# Patient Record
Sex: Male | Born: 1980 | Race: Black or African American | Hispanic: No | Marital: Single | Smoking: Never smoker
Health system: Southern US, Community
[De-identification: ages and names within clinical notes are randomized; demographics above are authoritative.]

## PROBLEM LIST (undated history)

## (undated) DIAGNOSIS — J45909 Unspecified asthma, uncomplicated: Secondary | ICD-10-CM

## (undated) HISTORY — PX: FOOT SURGERY: SHX648

---

## 2008-07-06 ENCOUNTER — Emergency Department (HOSPITAL_COMMUNITY): Admission: EM | Admit: 2008-07-06 | Discharge: 2008-07-06 | Payer: Self-pay | Admitting: Emergency Medicine

## 2009-01-24 ENCOUNTER — Emergency Department (HOSPITAL_COMMUNITY): Admission: EM | Admit: 2009-01-24 | Discharge: 2009-01-24 | Payer: Self-pay | Admitting: Emergency Medicine

## 2011-05-30 ENCOUNTER — Emergency Department (HOSPITAL_COMMUNITY)
Admission: EM | Admit: 2011-05-30 | Discharge: 2011-05-30 | Disposition: A | Discharge status: Home / Self Care | Payer: Self-pay | Attending: Emergency Medicine | Admitting: Emergency Medicine

## 2011-05-30 DIAGNOSIS — J40 Bronchitis, not specified as acute or chronic: Secondary | ICD-10-CM | POA: Insufficient documentation

## 2011-05-30 DIAGNOSIS — R059 Cough, unspecified: Secondary | ICD-10-CM | POA: Insufficient documentation

## 2011-05-30 DIAGNOSIS — R05 Cough: Secondary | ICD-10-CM | POA: Insufficient documentation

## 2011-05-30 DIAGNOSIS — J45909 Unspecified asthma, uncomplicated: Secondary | ICD-10-CM | POA: Insufficient documentation

## 2011-06-19 ENCOUNTER — Emergency Department (HOSPITAL_COMMUNITY)
Admission: EM | Admit: 2011-06-19 | Discharge: 2011-06-19 | Disposition: A | Discharge status: Home / Self Care | Payer: No Typology Code available for payment source | Attending: Emergency Medicine | Admitting: Emergency Medicine

## 2011-06-19 DIAGNOSIS — M25519 Pain in unspecified shoulder: Secondary | ICD-10-CM | POA: Insufficient documentation

## 2011-06-19 DIAGNOSIS — Y92410 Unspecified street and highway as the place of occurrence of the external cause: Secondary | ICD-10-CM | POA: Insufficient documentation

## 2011-06-19 DIAGNOSIS — S0990XA Unspecified injury of head, initial encounter: Secondary | ICD-10-CM | POA: Insufficient documentation

## 2011-06-23 ENCOUNTER — Emergency Department (HOSPITAL_COMMUNITY)
Admission: EM | Admit: 2011-06-23 | Discharge: 2011-06-23 | Disposition: A | Discharge status: Home / Self Care | Payer: No Typology Code available for payment source | Attending: Emergency Medicine | Admitting: Emergency Medicine

## 2011-06-23 DIAGNOSIS — M542 Cervicalgia: Secondary | ICD-10-CM | POA: Insufficient documentation

## 2011-06-25 ENCOUNTER — Inpatient Hospital Stay (INDEPENDENT_AMBULATORY_CARE_PROVIDER_SITE_OTHER)
Admission: RE | Admit: 2011-06-25 | Discharge: 2011-06-25 | Disposition: A | Discharge status: Home / Self Care | Payer: No Typology Code available for payment source | Source: Ambulatory Visit | Attending: Family Medicine | Admitting: Family Medicine

## 2011-06-25 ENCOUNTER — Ambulatory Visit (INDEPENDENT_AMBULATORY_CARE_PROVIDER_SITE_OTHER): Payer: No Typology Code available for payment source

## 2011-06-25 DIAGNOSIS — S139XXA Sprain of joints and ligaments of unspecified parts of neck, initial encounter: Secondary | ICD-10-CM

## 2011-06-25 DIAGNOSIS — IMO0002 Reserved for concepts with insufficient information to code with codable children: Secondary | ICD-10-CM

## 2011-06-25 DIAGNOSIS — R51 Headache: Secondary | ICD-10-CM

## 2018-02-12 ENCOUNTER — Ambulatory Visit: Payer: Self-pay | Admitting: Nurse Practitioner

## 2018-06-11 ENCOUNTER — Emergency Department (HOSPITAL_COMMUNITY)
Admission: EM | Admit: 2018-06-11 | Discharge: 2018-06-11 | Disposition: A | Discharge status: Home / Self Care | Payer: Self-pay | Attending: Emergency Medicine | Admitting: Emergency Medicine

## 2018-06-11 ENCOUNTER — Encounter (HOSPITAL_COMMUNITY): Payer: Self-pay

## 2018-06-11 ENCOUNTER — Ambulatory Visit (HOSPITAL_COMMUNITY)
Admission: EM | Admit: 2018-06-11 | Discharge: 2018-06-11 | Disposition: A | Discharge status: Home / Self Care | Payer: Self-pay | Attending: Family Medicine | Admitting: Family Medicine

## 2018-06-11 ENCOUNTER — Ambulatory Visit (HOSPITAL_COMMUNITY): Payer: Self-pay

## 2018-06-11 ENCOUNTER — Encounter (HOSPITAL_COMMUNITY): Payer: Self-pay | Admitting: Emergency Medicine

## 2018-06-11 ENCOUNTER — Other Ambulatory Visit: Payer: Self-pay

## 2018-06-11 DIAGNOSIS — M545 Low back pain, unspecified: Secondary | ICD-10-CM

## 2018-06-11 DIAGNOSIS — R9431 Abnormal electrocardiogram [ECG] [EKG]: Secondary | ICD-10-CM

## 2018-06-11 DIAGNOSIS — R1013 Epigastric pain: Secondary | ICD-10-CM | POA: Insufficient documentation

## 2018-06-11 DIAGNOSIS — M67432 Ganglion, left wrist: Secondary | ICD-10-CM

## 2018-06-11 DIAGNOSIS — K5900 Constipation, unspecified: Secondary | ICD-10-CM

## 2018-06-11 LAB — COMPREHENSIVE METABOLIC PANEL
ALK PHOS: 60 U/L (ref 38–126)
ALT: 28 U/L (ref 0–44)
AST: 28 U/L (ref 15–41)
Albumin: 3.7 g/dL (ref 3.5–5.0)
Anion gap: 8 (ref 5–15)
BILIRUBIN TOTAL: 0.6 mg/dL (ref 0.3–1.2)
BUN: 11 mg/dL (ref 6–20)
CALCIUM: 9.2 mg/dL (ref 8.9–10.3)
CO2: 25 mmol/L (ref 22–32)
CREATININE: 1.05 mg/dL (ref 0.61–1.24)
Chloride: 103 mmol/L (ref 98–111)
Glucose, Bld: 91 mg/dL (ref 70–99)
Potassium: 4 mmol/L (ref 3.5–5.1)
Sodium: 136 mmol/L (ref 135–145)
Total Protein: 6.8 g/dL (ref 6.5–8.1)

## 2018-06-11 LAB — CBC
HCT: 43.4 % (ref 39.0–52.0)
Hemoglobin: 14.6 g/dL (ref 13.0–17.0)
MCH: 25.7 pg — ABNORMAL LOW (ref 26.0–34.0)
MCHC: 33.6 g/dL (ref 30.0–36.0)
MCV: 76.5 fL — ABNORMAL LOW (ref 78.0–100.0)
PLATELETS: 264 10*3/uL (ref 150–400)
RBC: 5.67 MIL/uL (ref 4.22–5.81)
RDW: 14 % (ref 11.5–15.5)
WBC: 6.3 10*3/uL (ref 4.0–10.5)

## 2018-06-11 LAB — TROPONIN I

## 2018-06-11 LAB — LIPASE, BLOOD: Lipase: 28 U/L (ref 11–51)

## 2018-06-11 MED ORDER — FAMOTIDINE 20 MG PO TABS
20.0000 mg | ORAL_TABLET | Freq: Once | ORAL | Status: AC
  Administered 2018-06-11: 20 mg via ORAL
  Filled 2018-06-11: qty 1

## 2018-06-11 MED ORDER — GI COCKTAIL ~~LOC~~
30.0000 mL | Freq: Once | ORAL | Status: AC
  Administered 2018-06-11: 30 mL via ORAL

## 2018-06-11 MED ORDER — PANTOPRAZOLE SODIUM 40 MG PO TBEC: 40.0000 mg | DELAYED_RELEASE_TABLET | Freq: Every day | ORAL | 0 refills | Status: DC

## 2018-06-11 MED ORDER — GI COCKTAIL ~~LOC~~
30.0000 mL | Freq: Once | ORAL | Status: AC
  Administered 2018-06-11: 30 mL via ORAL
  Filled 2018-06-11: qty 30

## 2018-06-11 MED ORDER — GI COCKTAIL ~~LOC~~
ORAL | Status: AC
  Filled 2018-06-11: qty 30

## 2018-06-11 NOTE — ED Triage Notes (Signed)
Pt endorses epigastric pain x 3 days with dizziness. Pt went to [facility] and sent here for concerning EKG. VSS. Axox4. Denies CP or shob.

## 2018-06-11 NOTE — ED Provider Notes (Addendum)
[facility]    CSN: [id number] Arrival date & time: 06/11/18  1220     History   Chief Complaint Chief Complaint  Patient presents with  . Abdominal Pain    HPI Colonel Krauser is a 37 y.o. male.   Ed presents with complaints of three days of epigastric pain. Worse at night. No nausea or vomiting. Feels like better if he doesn't eat but unable to confirm if worsens with eating. Has occasional heartburn. Denies any current chest pain. No shortness of breath, cough, dizziness, arm or jaw pain. Low back worse with activity. Took advil two days ago which didn't seem to help with his back pain. Has been constipated. Took a laxative and this helped some but still having to strain to pass stool. Noted some bright red blood after this. No black in stool. Has not taken any other medications for symptoms. Pain 6/10. Also complaints of "bump" to left wrist. Not painful. Without contributing medical history. Denies hx of CAD, htn, or DM. No known cardiac history personally and denies family cardiac history. Doesn't smoke.     ROS per HPI.      History reviewed. No pertinent past medical history.  There are no active problems to display for this patient.   History reviewed. No pertinent surgical history.     Home Medications    Prior to Admission medications   Not on File    Family History No family history on file.  Social History Social History   Tobacco Use  . Smoking status: Not on file  Substance Use Topics  . Alcohol use: Not on file  . Drug use: Not on file     Allergies   Ibuprofen   Review of Systems Review of Systems   Physical Exam Triage Vital Signs ED Triage Vitals  Enc Vitals Group     BP 06/11/18 1335 137/65     Pulse Rate 06/11/18 1335 70     Resp 06/11/18 1335 18     Temp 06/11/18 1335 97.8 F (36.6 C)     Temp src --      SpO2 06/11/18 1335 100 %     Weight 06/11/18 1332 284 lb (128.8 kg)     Height --      Head  Circumference --      Peak Flow --      Pain Score --      Pain Loc --      Pain Edu? --      Excl. in GC? --    No data found.  Updated Vital Signs BP 137/65 (BP Location: Right Arm)   Pulse 70   Temp 97.8 F (36.6 C)   Resp 18   Wt 284 lb (128.8 kg)   SpO2 100%   Physical Exam  Constitutional: He is oriented to person, place, and time. He appears well-developed and well-nourished.  Cardiovascular: Normal rate and regular rhythm.  Pulmonary/Chest: Effort normal and breath sounds normal.  Abdominal: Soft. Bowel sounds are decreased. There is tenderness in the epigastric area. There is no rigidity, no rebound, no guarding, no CVA tenderness, no tenderness at McBurney's point and negative Murphy's sign.  Musculoskeletal:       Lumbar back: He exhibits tenderness and pain. He exhibits normal range of motion, no bony tenderness, no swelling, no edema, no deformity, no laceration, no spasm and normal pulse.  No spinous process tenderness, no step off or deformity; bilateral low back musculature with tenderness  on palpation; strength equal bilaterally; gross sensation intact to lower extremities; ambulatory without difficulty; mobile rubbery lesion to left anterior wrist, no redness or fluctuance, non tender    Neurological: He is alert and oriented to person, place, and time.  Skin: Skin is warm and dry.   Lead III with t wave inversions noted; no previous ekg for compare; NSR rate 66   UC Treatments / Results  Labs (all labs ordered are listed, but only abnormal results are displayed) Labs Reviewed - No data to display  EKG None  Radiology No results found.  Procedures Procedures (including critical care time)  Medications Ordered in UC Medications  gi cocktail (Maalox,Lidocaine,Donnatal) (has no administration in time range)    Initial Impression / Assessment and Plan / UC Course  I have reviewed the triage vital signs and the nursing notes.  Pertinent labs & imaging  results that were available during my care of the patient were reviewed by me and considered in my medical decision making (see chart for details).     Patient with epigastric pain for the past 3 days. Worse at night. Currently pain 4-6/10.  Worse at night. No nausea, vomiting, arm or jaw pain. Low back pain. No known cardiac history. Noted stsegment depression on ekg, no previous for comparison. Recommend complete cardiac eval in ER at this time. Patient provided transport by RN at this time. Patient verbalized understanding and agreeable to plan.    Ekg reviewed with supervising physician Dr. Milus Glazier who recommends ER transport at this time as well.  Final Clinical Impressions(s) / UC Diagnoses   Final diagnoses:  Abdominal pain, epigastric  Gastroesophageal reflux disease, esophagitis presence not specified  Acute bilateral low back pain without sciatica  Constipation, unspecified constipation type  Ganglion cyst of wrist, left   Discharge Instructions   None    ED Prescriptions    None     Controlled Substance Prescriptions [place] Controlled Substance Registry consulted? Not Applicable   Georgetta Haber, NP 06/11/18 1415    Georgetta Haber, NP 06/11/18 1417

## 2018-06-11 NOTE — ED Triage Notes (Signed)
Pt states he has stomach pain x 3 days and back pain.

## 2018-06-11 NOTE — ED Provider Notes (Addendum)
[facility] EMERGENCY DEPARTMENT Provider Note   CSN: [id number] Arrival date & time: 06/11/18  1419     History   Chief Complaint Chief Complaint  Patient presents with  . Abdominal Pain    HPI Jeffery Blackwell is a 37 y.o. male.  Patient c/o epigastric pain for the past 3 days. Symptoms occur at rest. Constant, dull, non radiating, moderate. States if anything is more noticeable/worse when tries to sleep at night. Occasionally worse w eating. No relation to exertion. No associated nv or diaphoresis. No palpitations. No chest pain or discomfort. No sob or unusual doe. No fam hx premature cad. Pt denies hx pud, pancreatitis or gallstones. No fever or chills.   The history is provided by the patient.  Abdominal Pain   Pertinent negatives include fever, vomiting and headaches.    History reviewed. No pertinent past medical history.  There are no active problems to display for this patient.   History reviewed. No pertinent surgical history.      Home Medications    Prior to Admission medications   Not on File    Family History History reviewed. No pertinent family history.  Social History Social History   Tobacco Use  . Smoking status: Never Smoker  Substance Use Topics  . Alcohol use: Never    Frequency: Never  . Drug use: Never     Allergies   Ibuprofen   Review of Systems Review of Systems  Constitutional: Negative for fever.  HENT: Negative for sore throat.   Eyes: Negative for redness.  Respiratory: Negative for shortness of breath.   Cardiovascular: Negative for chest pain and leg swelling.  Gastrointestinal: Positive for abdominal pain. Negative for vomiting.  Genitourinary: Negative for flank pain.  Musculoskeletal: Negative for neck pain.  Skin: Negative for rash.  Neurological: Negative for headaches.  Hematological: Does not bruise/bleed easily.  Psychiatric/Behavioral: Negative for confusion.     Physical  Exam Updated Vital Signs BP 118/85 (BP Location: Right Arm)   Pulse 72   Temp 98 F (36.7 C) (Oral)   Resp 16   Ht 1.829 m (6')   Wt 128.8 kg   SpO2 100%   BMI 38.52 kg/m   Physical Exam  Constitutional: He appears well-developed and well-nourished.  HENT:  Mouth/Throat: Oropharynx is clear and moist.  Eyes: Conjunctivae are normal.  Neck: Neck supple. No tracheal deviation present.  Cardiovascular: Normal rate, regular rhythm, normal heart sounds and intact distal pulses. Exam reveals no gallop and no friction rub.  No murmur heard. Pulmonary/Chest: Effort normal and breath sounds normal. No accessory muscle usage. No respiratory distress.  Abdominal: Soft. Bowel sounds are normal. He exhibits no distension. There is no tenderness.  Musculoskeletal: He exhibits no edema or tenderness.  T/L/S spine non tender, aligned, no step off  Neurological: He is alert.  Skin: Skin is warm and dry.  Psychiatric: He has a normal mood and affect.  Nursing note and vitals reviewed.    ED Treatments / Results  Labs (all labs ordered are listed, but only abnormal results are displayed) Results for orders placed or performed during the hospital encounter of 06/11/18  Lipase, blood  Result Value Ref Range   Lipase 28 11 - 51 U/L  Comprehensive metabolic panel  Result Value Ref Range   Sodium 136 135 - 145 mmol/L   Potassium 4.0 3.5 - 5.1 mmol/L   Chloride 103 98 - 111 mmol/L   CO2 25 22 - 32 mmol/L  Glucose, Bld 91 70 - 99 mg/dL   BUN 11 6 - 20 mg/dL   Creatinine, Ser 2.13 0.61 - 1.24 mg/dL   Calcium 9.2 8.9 - 08.6 mg/dL   Total Protein 6.8 6.5 - 8.1 g/dL   Albumin 3.7 3.5 - 5.0 g/dL   AST 28 15 - 41 U/L   ALT 28 0 - 44 U/L   Alkaline Phosphatase 60 38 - 126 U/L   Total Bilirubin 0.6 0.3 - 1.2 mg/dL   GFR calc non Af Amer >60 >60 mL/min   GFR calc Af Amer >60 >60 mL/min   Anion gap 8 5 - 15  CBC  Result Value Ref Range   WBC 6.3 4.0 - 10.5 K/uL   RBC 5.67 4.22 - 5.81 MIL/uL    Hemoglobin 14.6 13.0 - 17.0 g/dL   HCT 57.8 46.9 - 62.9 %   MCV 76.5 (L) 78.0 - 100.0 fL   MCH 25.7 (L) 26.0 - 34.0 pg   MCHC 33.6 30.0 - 36.0 g/dL   RDW 52.8 41.3 - 24.4 %   Platelets 264 150 - 400 K/uL  Troponin I  Result Value Ref Range   Troponin I <0.03 <0.03 ng/mL   US Abdomen Limited  Result Date: 06/11/2018 CLINICAL DATA:  Upper abdominal pain EXAM: ULTRASOUND ABDOMEN LIMITED RIGHT UPPER QUADRANT COMPARISON:  None. FINDINGS: Gallbladder: No gallstones or wall thickening visualized. There is no pericholecystic fluid. No sonographic Murphy sign noted by sonographer. Common bile duct: Diameter: 4 mm. No intrahepatic or extrahepatic biliary duct dilatation. Liver: No focal lesion identified. Within normal limits in parenchymal echogenicity. Portal vein is patent on color Doppler imaging with normal direction of blood flow towards the liver. IMPRESSION: Study within normal limits. Electronically Signed   By: Bretta Bang III M.D.   On: 06/11/2018 17:13    EKG EKG Interpretation  Date/Time:  Monday June 11 2018 14:22:32 EDT Ventricular Rate:  64 PR Interval:  162 QRS Duration: 84 QT Interval:  380 QTC Calculation: 392 R Axis:   53 Text Interpretation:  Normal sinus rhythm Nonspecific T wave abnormality Abnormal ECG No significant change since last tracing Confirmed by Melene Plan [phone]) on 06/11/2018 2:31:42 PM   Radiology US Abdomen Limited  Result Date: 06/11/2018 CLINICAL DATA:  Upper abdominal pain EXAM: ULTRASOUND ABDOMEN LIMITED RIGHT UPPER QUADRANT COMPARISON:  None. FINDINGS: Gallbladder: No gallstones or wall thickening visualized. There is no pericholecystic fluid. No sonographic Murphy sign noted by sonographer. Common bile duct: Diameter: 4 mm. No intrahepatic or extrahepatic biliary duct dilatation. Liver: No focal lesion identified. Within normal limits in parenchymal echogenicity. Portal vein is patent on color Doppler imaging with normal direction of blood  flow towards the liver. IMPRESSION: Study within normal limits. Electronically Signed   By: Bretta Bang III M.D.   On: 06/11/2018 17:13    Procedures Procedures (including critical care time)  Medications Ordered in ED Medications - No data to display   Initial Impression / Assessment and Plan / ED Course  I have reviewed the triage vital signs and the nursing notes.  Pertinent labs & imaging results that were available during my care of the patient were reviewed by me and considered in my medical decision making (see chart for details).  Labs. Ecg.   Reviewed nursing notes and prior charts for additional history.   For epigastric pain, will try pepcid and gi cocktail for symptom relief - pt with relief of symptoms.   Labs reviewed - after constant/continuous  symptoms for 3 days, trop normal.   U/s reviewed - no gallstones.   Discussed above results w pt.   No current or previous chest discomfort. No sob. abd pain resolved.   ?possible gastritis. rx for home. Close pcp f/u.   Final Clinical Impressions(s) / ED Diagnoses   Final diagnoses:  None    ED Discharge Orders    None         Cathren Laine, MD 06/11/18 [phone]

## 2018-06-11 NOTE — Discharge Instructions (Addendum)
It was our pleasure to provide your ER care today - we hope that you feel better.  Take protonix (acid blocker medication). You may also try pepcid or maalox for symptom relief.   Follow up closely with primary care doctor in the next 1-2 weeks.   Return to ER if worse, new symptoms, fevers, severe abdominal pain, persistent vomiting, chest pain, trouble breathing, other concern.

## 2018-06-11 NOTE — Discharge Instructions (Signed)
Please go to the ER for further evaluation as there are changes on your EKG which are concerning

## 2018-06-11 NOTE — ED Notes (Signed)
Patient verbalizes understanding of discharge instructions. Opportunity for questioning and answers were provided. Armband removed by staff, pt discharged from ED.  

## 2018-06-11 NOTE — ED Notes (Signed)
Patient transported to [facility] 

## 2018-07-06 ENCOUNTER — Encounter (HOSPITAL_BASED_OUTPATIENT_CLINIC_OR_DEPARTMENT_OTHER): Payer: Self-pay

## 2018-07-06 ENCOUNTER — Emergency Department (HOSPITAL_BASED_OUTPATIENT_CLINIC_OR_DEPARTMENT_OTHER)
Admission: EM | Admit: 2018-07-06 | Discharge: 2018-07-06 | Disposition: A | Discharge status: Home / Self Care | Payer: Self-pay | Attending: Emergency Medicine | Admitting: Emergency Medicine

## 2018-07-06 ENCOUNTER — Other Ambulatory Visit: Payer: Self-pay

## 2018-07-06 DIAGNOSIS — R51 Headache: Secondary | ICD-10-CM | POA: Insufficient documentation

## 2018-07-06 DIAGNOSIS — Z79899 Other long term (current) drug therapy: Secondary | ICD-10-CM | POA: Insufficient documentation

## 2018-07-06 DIAGNOSIS — M545 Low back pain, unspecified: Secondary | ICD-10-CM

## 2018-07-06 DIAGNOSIS — M542 Cervicalgia: Secondary | ICD-10-CM | POA: Insufficient documentation

## 2018-07-06 DIAGNOSIS — R519 Headache, unspecified: Secondary | ICD-10-CM

## 2018-07-06 MED ORDER — METHOCARBAMOL 750 MG PO TABS: 750.0000 mg | ORAL_TABLET | Freq: Three times a day (TID) | ORAL | 0 refills | Status: DC | PRN

## 2018-07-06 NOTE — Discharge Instructions (Addendum)
Please take Tylenol (acetaminophen) to relieve your pain.  You may take tylenol, up to 1,000 mg (two extra strength pills) every 6 hours.  Do not take more than 4,000 mg tylenol in a 24 hour period.  Please check all medication labels as many medications such as pain and cold medications may contain tylenol. Please do not drink alcohol while taking this medication.   You are being prescribed a medication which may make you sleepy. For 24 hours after one dose please do not drive, operate heavy machinery, care for a small child with out another adult present, or perform any activities that may cause harm to you or someone else if you were to fall asleep or be impaired.
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
## 2018-07-07 ENCOUNTER — Emergency Department (HOSPITAL_COMMUNITY)
Admission: EM | Admit: 2018-07-07 | Discharge: 2018-07-07 | Disposition: A | Discharge status: Home / Self Care | Payer: Self-pay | Attending: Emergency Medicine | Admitting: Emergency Medicine

## 2018-07-07 ENCOUNTER — Encounter (HOSPITAL_COMMUNITY): Payer: Self-pay | Admitting: Emergency Medicine

## 2018-07-07 ENCOUNTER — Ambulatory Visit (HOSPITAL_COMMUNITY)
Admission: EM | Admit: 2018-07-07 | Discharge: 2018-07-07 | Disposition: A | Discharge status: Home / Self Care | Payer: Self-pay

## 2018-07-07 DIAGNOSIS — Y999 Unspecified external cause status: Secondary | ICD-10-CM | POA: Insufficient documentation

## 2018-07-07 DIAGNOSIS — T148XXA Other injury of unspecified body region, initial encounter: Secondary | ICD-10-CM | POA: Insufficient documentation

## 2018-07-07 DIAGNOSIS — G44309 Post-traumatic headache, unspecified, not intractable: Secondary | ICD-10-CM | POA: Insufficient documentation

## 2018-07-07 DIAGNOSIS — Y9389 Activity, other specified: Secondary | ICD-10-CM | POA: Insufficient documentation

## 2018-07-07 DIAGNOSIS — Y92410 Unspecified street and highway as the place of occurrence of the external cause: Secondary | ICD-10-CM | POA: Insufficient documentation

## 2018-07-07 DIAGNOSIS — J45909 Unspecified asthma, uncomplicated: Secondary | ICD-10-CM | POA: Insufficient documentation

## 2018-07-07 DIAGNOSIS — Z79899 Other long term (current) drug therapy: Secondary | ICD-10-CM | POA: Insufficient documentation

## 2018-07-07 HISTORY — DX: Unspecified asthma, uncomplicated: J45.909
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
## 2018-07-07 NOTE — Discharge Instructions (Addendum)
Continue taking Robaxin 3 times a day. Continue taking Tylenol 3 times a day. Use muscle cream such as salonpas, icy hot, BenGay to help with your pain. Decrease stimulation to the brain.  This means decrease lights, sounds, and screen time. Follow-up with Dr. Katrinka Blazing for further evaluation of your headaches if they persist for more than a week. You likely have continued muscle pain and soreness over the next several days.  This should be improving within a week.  If it is not, follow-up with Dr. Katrinka Blazing. Return to the emergency room if you develop vision loss, vomiting, numbness, difficulty walking, or any new or concerning symptom.
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
## 2018-07-11 ENCOUNTER — Telehealth: Payer: Self-pay
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
## 2018-07-12 ENCOUNTER — Ambulatory Visit (INDEPENDENT_AMBULATORY_CARE_PROVIDER_SITE_OTHER): Payer: Self-pay | Admitting: Family Medicine

## 2018-07-12 DIAGNOSIS — S060X0A Concussion without loss of consciousness, initial encounter: Secondary | ICD-10-CM

## 2018-07-12 DIAGNOSIS — S060XAA Concussion with loss of consciousness status unknown, initial encounter: Secondary | ICD-10-CM | POA: Insufficient documentation

## 2018-07-12 DIAGNOSIS — S060X9A Concussion with loss of consciousness of unspecified duration, initial encounter: Secondary | ICD-10-CM | POA: Insufficient documentation

## 2018-07-12 NOTE — Patient Instructions (Signed)
Good to see you  Mild concussion  Stay hydrated  Fish oil 3 grams dialy for 10 days then 2 grams daily thereafter Vitamin D 2000 IU daily  CoQ10 200mg  daily  See [place] again on Tuesday and hopefully we will get you back to work
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
## 2018-07-17 ENCOUNTER — Ambulatory Visit: Payer: Self-pay | Admitting: Family Medicine

## 2018-07-17 DIAGNOSIS — S060X0D Concussion without loss of consciousness, subsequent encounter: Secondary | ICD-10-CM

## 2018-07-17 MED ORDER — TRAZODONE HCL 50 MG PO TABS: 25.0000 mg | ORAL_TABLET | Freq: Every evening | ORAL | 3 refills | Status: DC | PRN
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
## 2018-07-17 NOTE — Assessment & Plan Note (Signed)
Patient is making improvement.  On physical exam nothing significant.  Patient still seems somewhat tired though.  Patient was to decrease the muscle relaxer and seems to be doing better overall but does have some irritability. Sleep troubles and trazodone.  Patient will come back and see me again in 10 days
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
## 2018-07-30 ENCOUNTER — Encounter: Payer: Self-pay | Admitting: Family Medicine

## 2018-07-30 ENCOUNTER — Ambulatory Visit (INDEPENDENT_AMBULATORY_CARE_PROVIDER_SITE_OTHER): Payer: Self-pay | Admitting: Family Medicine

## 2018-07-30 DIAGNOSIS — S060X0D Concussion without loss of consciousness, subsequent encounter: Secondary | ICD-10-CM

## 2018-07-30 NOTE — Patient Instructions (Signed)
Good to see you  We will get you back on Monday  Stop one vitamin a week until they are all gone.  Get a little more active See me again only if you need me Happy holidays!

## 2018-07-30 NOTE — Assessment & Plan Note (Signed)
Patient symptoms have completely resolved at this time.  Patient will start with full duty on Monday.  Discussed icing regimen and home exercises and the vitamin supplementation and decreasing appropriately.  Follow-up again as needed

## 2018-09-12 ENCOUNTER — Ambulatory Visit (HOSPITAL_COMMUNITY)
Admission: EM | Admit: 2018-09-12 | Discharge: 2018-09-12 | Disposition: A | Discharge status: Home / Self Care | Payer: Self-pay | Attending: Family Medicine | Admitting: Family Medicine

## 2018-09-12 ENCOUNTER — Encounter (HOSPITAL_COMMUNITY): Payer: Self-pay

## 2018-09-12 ENCOUNTER — Other Ambulatory Visit: Payer: Self-pay

## 2018-09-12 DIAGNOSIS — M5442 Lumbago with sciatica, left side: Secondary | ICD-10-CM

## 2018-09-12 DIAGNOSIS — M5441 Lumbago with sciatica, right side: Secondary | ICD-10-CM

## 2018-09-12 DIAGNOSIS — G8929 Other chronic pain: Secondary | ICD-10-CM

## 2018-09-12 MED ORDER — KETOROLAC TROMETHAMINE 60 MG/2ML IM SOLN
INTRAMUSCULAR | Status: AC
  Filled 2018-09-12: qty 2

## 2018-09-12 MED ORDER — PREDNISONE 10 MG PO TABS: 20.0000 mg | ORAL_TABLET | Freq: Every day | ORAL | 0 refills | Status: DC

## 2018-09-12 MED ORDER — KETOROLAC TROMETHAMINE 60 MG/2ML IM SOLN
60.0000 mg | Freq: Once | INTRAMUSCULAR | Status: AC
  Administered 2018-09-12: 60 mg via INTRAMUSCULAR

## 2018-09-12 NOTE — ED Triage Notes (Signed)
Pt cc lower back pain and a migraine x 9 days
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
## 2018-09-13 ENCOUNTER — Other Ambulatory Visit: Payer: Self-pay | Admitting: Chiropractic Medicine

## 2018-09-13 DIAGNOSIS — M9903 Segmental and somatic dysfunction of lumbar region: Secondary | ICD-10-CM

## 2018-09-13 DIAGNOSIS — M9901 Segmental and somatic dysfunction of cervical region: Secondary | ICD-10-CM

## 2018-09-22 ENCOUNTER — Ambulatory Visit
Admission: RE | Admit: 2018-09-22 | Discharge: 2018-09-22 | Disposition: A | Discharge status: Home / Self Care | Payer: PRIVATE HEALTH INSURANCE | Source: Ambulatory Visit | Attending: Chiropractic Medicine | Admitting: Chiropractic Medicine

## 2018-09-22 DIAGNOSIS — M9903 Segmental and somatic dysfunction of lumbar region: Secondary | ICD-10-CM

## 2018-09-22 DIAGNOSIS — M9901 Segmental and somatic dysfunction of cervical region: Secondary | ICD-10-CM

## 2019-03-25 ENCOUNTER — Emergency Department (HOSPITAL_BASED_OUTPATIENT_CLINIC_OR_DEPARTMENT_OTHER)
Admission: EM | Admit: 2019-03-25 | Discharge: 2019-03-25 | Disposition: A | Discharge status: Home / Self Care | Payer: Self-pay | Attending: Emergency Medicine | Admitting: Emergency Medicine

## 2019-03-25 ENCOUNTER — Other Ambulatory Visit: Payer: Self-pay

## 2019-03-25 ENCOUNTER — Emergency Department (HOSPITAL_BASED_OUTPATIENT_CLINIC_OR_DEPARTMENT_OTHER): Payer: Self-pay

## 2019-03-25 ENCOUNTER — Encounter (HOSPITAL_BASED_OUTPATIENT_CLINIC_OR_DEPARTMENT_OTHER): Payer: Self-pay | Admitting: *Deleted

## 2019-03-25 DIAGNOSIS — Z79899 Other long term (current) drug therapy: Secondary | ICD-10-CM | POA: Insufficient documentation

## 2019-03-25 DIAGNOSIS — W502XXA Accidental twist by another person, initial encounter: Secondary | ICD-10-CM | POA: Insufficient documentation

## 2019-03-25 DIAGNOSIS — Y999 Unspecified external cause status: Secondary | ICD-10-CM | POA: Insufficient documentation

## 2019-03-25 DIAGNOSIS — Y9302 Activity, running: Secondary | ICD-10-CM | POA: Insufficient documentation

## 2019-03-25 DIAGNOSIS — J45909 Unspecified asthma, uncomplicated: Secondary | ICD-10-CM | POA: Insufficient documentation

## 2019-03-25 DIAGNOSIS — I10 Essential (primary) hypertension: Secondary | ICD-10-CM | POA: Insufficient documentation

## 2019-03-25 DIAGNOSIS — Y929 Unspecified place or not applicable: Secondary | ICD-10-CM | POA: Insufficient documentation

## 2019-03-25 DIAGNOSIS — S93402A Sprain of unspecified ligament of left ankle, initial encounter: Secondary | ICD-10-CM | POA: Insufficient documentation

## 2019-03-25 NOTE — ED Triage Notes (Signed)
Pt states he was running on Friday and twisted his left ankle, has had ankle and foot pain since. Denies any other injury or c/o.

## 2019-03-25 NOTE — ED Provider Notes (Signed)
[facility] EMERGENCY DEPARTMENT Provider Note   CSN: [id number] Arrival date & time: 03/25/19  1031    History   Chief Complaint Chief Complaint  Patient presents with  . Ankle Pain    HPI Jeffery Blackwell is a 38 y.o. male.     HPI Patient presents to the emergency room for evaluation of left foot and ankle pain.  Patient states he sustained a twisting injury to his left foot and ankle when he fell.  Patient denies any other injuries.  He is having pain in the left foot as well as left ankle.  It is sharp and increases with palpation and walking.  No numbness or weakness. Past Medical History:  Diagnosis Date  . Asthma     Patient Active Problem List   Diagnosis Date Noted  . Mild concussion 07/12/2018    History reviewed. No pertinent surgical history.      Home Medications    Prior to Admission medications   Medication Sig Start Date End Date Taking? Authorizing Provider  methocarbamol (ROBAXIN) 750 MG tablet Take 1-2 tablets (750-1,500 mg total) by mouth 3 (three) times daily as needed for muscle spasms. 07/06/18   Cristina GongHammond, Elizabeth W, PA-C  pantoprazole (PROTONIX) 40 MG tablet Take 1 tablet (40 mg total) by mouth daily. 06/11/18   Cathren LaineSteinl, Kevin, MD  predniSONE (DELTASONE) 10 MG tablet Take 2 tablets (20 mg total) by mouth daily. Take 3 tablets x 2 days, 2 tablets x 2 days then 1 tablet daily until no pills 09/12/18   Frederica KusterMiller, Stephen M, MD  traZODone (DESYREL) 50 MG tablet Take 0.5-1 tablets (25-50 mg total) by mouth at bedtime as needed for sleep. 07/17/18   Judi SaaSmith, Zachary M, DO    Family History History reviewed. No pertinent family history.  Social History Social History   Tobacco Use  . Smoking status: Never Smoker  . Smokeless tobacco: Never Used  Substance Use Topics  . Alcohol use: Never    Frequency: Never  . Drug use: Never     Allergies   Ibuprofen   Review of Systems Review of Systems  All other systems reviewed and are  negative.    Physical Exam Updated Vital Signs BP 123/87 (BP Location: Right Arm)   Pulse 70   Temp 98.3 F (36.8 C) (Oral)   Resp 18   Ht 1.829 m (6')   Wt 117.9 kg   SpO2 98%   BMI 35.26 kg/m   Physical Exam Vitals signs and nursing note reviewed.  Constitutional:      General: He is not in acute distress.    Appearance: He is well-developed.  HENT:     Head: Normocephalic and atraumatic.     Right Ear: External ear normal.     Left Ear: External ear normal.  Eyes:     General: No scleral icterus.       Right eye: No discharge.        Left eye: No discharge.     Conjunctiva/sclera: Conjunctivae normal.  Neck:     Musculoskeletal: Neck supple.     Trachea: No tracheal deviation.  Cardiovascular:     Rate and Rhythm: Normal rate.  Pulmonary:     Effort: Pulmonary effort is normal. No respiratory distress.     Breath sounds: No stridor.  Abdominal:     General: There is no distension.  Musculoskeletal:        General: No swelling or deformity.     Left  ankle: He exhibits swelling. Tenderness. Lateral malleolus tenderness found.     Left foot: Tenderness, bony tenderness and swelling present.     Comments: Neurovascularly intact, no rash, bruising or cyanosis   Skin:    General: Skin is warm and dry.     Findings: No rash.  Neurological:     Mental Status: He is alert.     Cranial Nerves: Cranial nerve deficit: no gross deficits.      ED Treatments / Results  Labs (all labs ordered are listed, but only abnormal results are displayed) Labs Reviewed - No data to display  EKG None  Radiology Dg Ankle Complete Left  Result Date: 03/25/2019 CLINICAL DATA:  Left foot and ankle pain, twisting injury. EXAM: LEFT ANKLE COMPLETE - 3+ VIEW; LEFT FOOT - COMPLETE 3+ VIEW COMPARISON:  None. FINDINGS: There is no evidence of fracture, dislocation, or joint effusion. Ankle mortise is congruent. Small os peroneum incidentally noted. There is no evidence of arthropathy  or other focal bone abnormality. Soft tissues are unremarkable. IMPRESSION: No acute osseous abnormality, left foot or ankle. Electronically Signed   By: Davina Poke M.D.   On: 03/25/2019 10:56   Dg Foot Complete Left  Result Date: 03/25/2019 CLINICAL DATA:  Left foot and ankle pain, twisting injury. EXAM: LEFT ANKLE COMPLETE - 3+ VIEW; LEFT FOOT - COMPLETE 3+ VIEW COMPARISON:  None. FINDINGS: There is no evidence of fracture, dislocation, or joint effusion. Ankle mortise is congruent. Small os peroneum incidentally noted. There is no evidence of arthropathy or other focal bone abnormality. Soft tissues are unremarkable. IMPRESSION: No acute osseous abnormality, left foot or ankle. Electronically Signed   By: Davina Poke M.D.   On: 03/25/2019 10:56    Procedures Procedures (including critical care time)  Medications Ordered in ED Medications - No data to display   Initial Impression / Assessment and Plan / ED Course  I have reviewed the triage vital signs and the nursing notes.  Pertinent labs & imaging results that were available during my care of the patient were reviewed by me and considered in my medical decision making (see chart for details).   Xrays negative.  C/w ankle sprain.  ASO splint.  Rest, ice.  PRN otc meds.  Follow up as needed  Final Clinical Impressions(s) / ED Diagnoses   Final diagnoses:  Sprain of left ankle, unspecified ligament, initial encounter    ED Discharge Orders    None       Dorie Rank, MD 03/25/19 1116

## 2019-04-01 ENCOUNTER — Encounter (HOSPITAL_BASED_OUTPATIENT_CLINIC_OR_DEPARTMENT_OTHER): Payer: Self-pay | Admitting: *Deleted

## 2019-04-01 ENCOUNTER — Emergency Department (HOSPITAL_BASED_OUTPATIENT_CLINIC_OR_DEPARTMENT_OTHER)
Admission: EM | Admit: 2019-04-01 | Discharge: 2019-04-01 | Disposition: A | Discharge status: Home / Self Care | Payer: No Typology Code available for payment source | Attending: Emergency Medicine | Admitting: Emergency Medicine

## 2019-04-01 ENCOUNTER — Other Ambulatory Visit: Payer: Self-pay

## 2019-04-01 DIAGNOSIS — S93402A Sprain of unspecified ligament of left ankle, initial encounter: Secondary | ICD-10-CM | POA: Insufficient documentation

## 2019-04-01 DIAGNOSIS — J45909 Unspecified asthma, uncomplicated: Secondary | ICD-10-CM | POA: Diagnosis not present

## 2019-04-01 DIAGNOSIS — X501XXA Overexertion from prolonged static or awkward postures, initial encounter: Secondary | ICD-10-CM | POA: Insufficient documentation

## 2019-04-01 DIAGNOSIS — S99912A Unspecified injury of left ankle, initial encounter: Secondary | ICD-10-CM | POA: Diagnosis present

## 2019-04-01 DIAGNOSIS — Y999 Unspecified external cause status: Secondary | ICD-10-CM | POA: Diagnosis not present

## 2019-04-01 DIAGNOSIS — Y929 Unspecified place or not applicable: Secondary | ICD-10-CM | POA: Diagnosis not present

## 2019-04-01 DIAGNOSIS — Y9302 Activity, running: Secondary | ICD-10-CM | POA: Diagnosis not present

## 2019-04-01 MED ORDER — CYCLOBENZAPRINE HCL 10 MG PO TABS: 10.0000 mg | ORAL_TABLET | Freq: Two times a day (BID) | ORAL | 0 refills | Status: DC | PRN

## 2019-04-01 NOTE — ED Provider Notes (Signed)
[facility] EMERGENCY DEPARTMENT Provider Note   CSN: [id number] Arrival date & time: 04/01/19  [id number]    History   Chief Complaint Chief Complaint  Patient presents with  . Ankle Pain    HPI Jeffery Blackwell is a 38 y.o. male.     The history is provided by the patient.  Ankle Pain Location:  Ankle Time since incident:  1 week Injury: yes   Ankle location:  L ankle Pain details:    Quality:  Aching   Onset quality:  Gradual   Timing:  Intermittent   Progression:  Waxing and waning Chronicity:  New Relieved by:  Acetaminophen Worsened by:  Bearing weight Associated symptoms: no back pain, no decreased ROM, no fever, no neck pain and no numbness     Past Medical History:  Diagnosis Date  . Asthma     Patient Active Problem List   Diagnosis Date Noted  . Mild concussion 07/12/2018    History reviewed. No pertinent surgical history.      Home Medications    Prior to Admission medications   Medication Sig Start Date End Date Taking? Authorizing Provider  cyclobenzaprine (FLEXERIL) 10 MG tablet Take 1 tablet (10 mg total) by mouth 2 (two) times daily as needed for up to 20 doses for muscle spasms. 04/01/19   Virgina Norfolkuratolo, Deanda Ruddell, DO    Family History History reviewed. No pertinent family history.  Social History Social History   Tobacco Use  . Smoking status: Never Smoker  . Smokeless tobacco: Never Used  Substance Use Topics  . Alcohol use: Never    Frequency: Never  . Drug use: Never     Allergies   Ibuprofen   Review of Systems Review of Systems  Constitutional: Negative for fever.  Musculoskeletal: Positive for arthralgias. Negative for back pain, joint swelling, myalgias, neck pain and neck stiffness.  Skin: Negative for color change and rash.  Neurological: Negative for weakness and numbness.     Physical Exam Updated Vital Signs  ED Triage Vitals  Enc Vitals Group     BP      Pulse      Resp      Temp      Temp src     SpO2      Weight      Height      Head Circumference      Peak Flow      Pain Score      Pain Loc      Pain Edu?      Excl. in GC?     Physical Exam Vitals signs and nursing note reviewed.  Constitutional:      Appearance: He is well-developed.  HENT:     Head: Normocephalic and atraumatic.  Eyes:     Conjunctiva/sclera: Conjunctivae normal.  Neck:     Musculoskeletal: Neck supple.  Cardiovascular:     Rate and Rhythm: Normal rate and regular rhythm.     Pulses: Normal pulses.     Heart sounds: No murmur.  Pulmonary:     Effort: Pulmonary effort is normal. No respiratory distress.     Breath sounds: Normal breath sounds.  Abdominal:     Palpations: Abdomen is soft.     Tenderness: There is no abdominal tenderness.  Musculoskeletal: Normal range of motion.        General: Tenderness present. No swelling.  Skin:    General: Skin is warm and dry.     Capillary  Refill: Capillary refill takes less than 2 seconds.  Neurological:     General: No focal deficit present.     Mental Status: He is alert.     Sensory: No sensory deficit.     Motor: No weakness.      ED Treatments / Results  Labs (all labs ordered are listed, but only abnormal results are displayed) Labs Reviewed - No data to display  EKG None  Radiology No results found.  Procedures Procedures (including critical care time)  Medications Ordered in ED Medications - No data to display   Initial Impression / Assessment and Plan / ED Course  I have reviewed the triage vital signs and the nursing notes.  Pertinent labs & imaging results that were available during my care of the patient were reviewed by me and considered in my medical decision making (see chart for details).     Jeffery Blackwell is a 38 year old male who presents to the ED with left ankle pain.  Patient with normal vitals.  No fever.  Pain has been ongoing since last week.  Had x-rays at that time that were normal of his left ankle.   Patient likely with left ankle sprain.  Has not been doing any home exercises.  States that he has tightness in his calf and his ankle with range of motion.  However I am able to fully range his ankle but with pain.  Patient likely with ankle sprain and educated him about how this could be a very long recovery.  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  Given information to follow-up with sports medicine.  Given Flexeril for muscle relaxant.  Given home exercises to do.  May benefit from physical therapy.  Discharged from the ED in good condition.  Given return precautions.  This chart was dictated using voice recognition software.  Despite best efforts to proofread,  errors can occur which can change the documentation meaning.    Final Clinical Impressions(s) / ED Diagnoses   Final diagnoses:  Sprain of left ankle, unspecified ligament, initial encounter    ED Discharge Orders         Ordered    cyclobenzaprine (FLEXERIL) 10 MG tablet  2 times daily PRN     04/01/19 1016           Duran Ohern, DO 04/01/19 1019

## 2019-04-01 NOTE — ED Notes (Signed)
ED Provider at bedside. 

## 2019-04-01 NOTE — ED Triage Notes (Signed)
Injured self a week ago from running and came here for follow-up.

## 2019-04-24 ENCOUNTER — Encounter (HOSPITAL_BASED_OUTPATIENT_CLINIC_OR_DEPARTMENT_OTHER): Payer: Self-pay | Admitting: Emergency Medicine

## 2019-04-24 ENCOUNTER — Emergency Department (HOSPITAL_BASED_OUTPATIENT_CLINIC_OR_DEPARTMENT_OTHER)
Admission: EM | Admit: 2019-04-24 | Discharge: 2019-04-24 | Disposition: A | Discharge status: Home / Self Care | Payer: Self-pay | Attending: Emergency Medicine | Admitting: Emergency Medicine

## 2019-04-24 ENCOUNTER — Other Ambulatory Visit: Payer: Self-pay

## 2019-04-24 DIAGNOSIS — M79605 Pain in left leg: Secondary | ICD-10-CM | POA: Insufficient documentation

## 2019-04-24 DIAGNOSIS — J45909 Unspecified asthma, uncomplicated: Secondary | ICD-10-CM | POA: Insufficient documentation

## 2019-04-24 DIAGNOSIS — M79604 Pain in right leg: Secondary | ICD-10-CM

## 2019-04-24 DIAGNOSIS — M791 Myalgia, unspecified site: Secondary | ICD-10-CM | POA: Insufficient documentation

## 2019-04-24 LAB — BASIC METABOLIC PANEL
Anion gap: 8 (ref 5–15)
BUN: 17 mg/dL (ref 6–20)
CO2: 26 mmol/L (ref 22–32)
Calcium: 9.3 mg/dL (ref 8.9–10.3)
Chloride: 99 mmol/L (ref 98–111)
Creatinine, Ser: 1.06 mg/dL (ref 0.61–1.24)
GFR calc Af Amer: >60 mL/min (ref >60)
GFR calc non Af Amer: >60 mL/min (ref >60)
Glucose, Bld: 104 mg/dL — ABNORMAL HIGH (ref 70–99)
Potassium: 4.7 mmol/L (ref 3.5–5.1)
Sodium: 133 mmol/L — ABNORMAL LOW (ref 135–145)

## 2019-04-24 LAB — CBC WITH DIFFERENTIAL/PLATELET
Abs Immature Granulocytes: 0.02 10*3/uL (ref 0.00–0.07)
Basophils Absolute: 0 10*3/uL (ref 0.0–0.1)
Basophils Relative: 1 %
Eosinophils Absolute: 0.1 10*3/uL (ref 0.0–0.5)
Eosinophils Relative: 2 %
HCT: 42.9 % (ref 39.0–52.0)
Hemoglobin: 14.4 g/dL (ref 13.0–17.0)
Immature Granulocytes: 0 %
Lymphocytes Relative: 27 %
Lymphs Abs: 1.7 10*3/uL (ref 0.7–4.0)
MCH: 25.1 pg — ABNORMAL LOW (ref 26.0–34.0)
MCHC: 33.6 g/dL (ref 30.0–36.0)
MCV: 74.7 fL — ABNORMAL LOW (ref 80.0–100.0)
Monocytes Absolute: 0.6 10*3/uL (ref 0.1–1.0)
Monocytes Relative: 9 %
Neutro Abs: 3.9 10*3/uL (ref 1.7–7.7)
Neutrophils Relative %: 61 %
Platelets: 272 10*3/uL (ref 150–400)
RBC: 5.74 MIL/uL (ref 4.22–5.81)
RDW: 14.4 % (ref 11.5–15.5)
WBC: 6.4 10*3/uL (ref 4.0–10.5)
nRBC: 0 % (ref 0.0–0.2)

## 2019-04-24 LAB — CK: Total CK: 410 U/L — ABNORMAL HIGH (ref 49–397)

## 2019-04-24 MED ORDER — METHOCARBAMOL 500 MG PO TABS: 1000.0000 mg | ORAL_TABLET | Freq: Three times a day (TID) | ORAL | 0 refills | Status: DC | PRN

## 2019-04-24 NOTE — ED Provider Notes (Signed)
[facility] EM[facility]GENC[facility] DEPARTMENT Provider Note   CSN: [id number] Arrival date & time: 04/24/19  1641     History   Chief Complaint Chief Complaint  Patient presents with  . Leg Pain    bilateral    HPI Jeffery Blackwell is a 38 y.o. male.  He has no significant past medical history.  He is complaining of 2 days of intermittent severe bilateral thigh pain cramping in nature that radiates up into his flanks down to his calves.  He says this is happened before and he usually attributes it to being dehydrated because he works outside Restaurant manager, fast fooddoing construction.  He said it caused him to be able to unable to walk and he needed to sit in the tub soaking them to help improve the pain.  Associated with a little bit of tingling in his calves.  No bowel or bladder incontinence.  No known injury.  Said he was on light duty from an ankle injury last month and initially started to get back to full duty so maybe he is a little deconditioned.     The history is provided by the patient.  Leg Pain Location:  Leg Injury: no   Leg location:  L leg and R leg Pain details:    Quality:  Cramping   Radiates to:  L flank, R flank, L leg and R leg   Severity:  Severe   Onset quality:  Sudden   Duration:  2 days   Timing:  Intermittent   Progression:  Unchanged Chronicity:  Recurrent Dislocation: no   Foreign body present:  No foreign bodies Relieved by:  Rest Worsened by:  Activity Ineffective treatments: icyhot. Associated symptoms: back pain and tingling   Associated symptoms: no fever, no numbness and no swelling     Past Medical History:  Diagnosis Date  . Asthma     Patient Active Problem List   Diagnosis Date Noted  . Mild concussion 07/12/2018    History reviewed. No pertinent surgical history.      Home Medications    Prior to Admission medications   Medication Sig Start Date End Date Taking? Authorizing Provider  cyclobenzaprine (FLEXERIL) 10 MG tablet Take 1 tablet (10  mg total) by mouth 2 (two) times daily as needed for up to 20 doses for muscle spasms. 04/01/19   Virgina Norfolkuratolo, Adam, DO    Family History No family history on file.  Social History Social History   Tobacco Use  . Smoking status: Never Smoker  . Smokeless tobacco: Never Used  Substance Use Topics  . Alcohol use: Never    Frequency: Never  . Drug use: Never     Allergies   Ibuprofen   Review of Systems Review of Systems  Constitutional: Negative for fever.  HENT: Negative for sore throat.   Eyes: Negative for visual disturbance.  Respiratory: Negative for shortness of breath.   Cardiovascular: Negative for chest pain.  Gastrointestinal: Negative for abdominal pain.  Genitourinary: Negative for dysuria.  Musculoskeletal: Positive for back pain and myalgias.  Skin: Negative for rash.  Neurological: Negative for headaches.     Physical Exam Updated Vital Signs BP 134/67   Pulse 86   Temp 98.2 F (36.8 C) (Oral)   Resp 18   Ht 6' (1.829 m)   Wt 136.1 kg   SpO2 97%   BMI 40.69 kg/m   Physical Exam Vitals signs and nursing note reviewed.  Constitutional:      Appearance: He is well-developed.  HENT:     Head: Normocephalic and atraumatic.  Eyes:     Conjunctiva/sclera: Conjunctivae normal.  Neck:     Musculoskeletal: Neck supple.  Cardiovascular:     Rate and Rhythm: Normal rate and regular rhythm.     Heart sounds: No murmur.  Pulmonary:     Effort: Pulmonary effort is normal. No respiratory distress.     Breath sounds: Normal breath sounds.  Abdominal:     Palpations: Abdomen is soft.     Tenderness: There is no abdominal tenderness.  Musculoskeletal: Normal range of motion.        General: Tenderness present. No deformity.     Comments: He has some diffuse palpable tenderness through his thighs.  He has normal flexion and extension.  His joints are nontender.  Full range of motion at back and hips without any limitations.  Distal motor and sensation  intact.  Distal pulses intact.  Skin:    General: Skin is warm and dry.     Capillary Refill: Capillary refill takes less than 2 seconds.  Neurological:     General: No focal deficit present.     Mental Status: He is alert and oriented to person, place, and time.      ED Treatments / Results  Labs (all labs ordered are listed, but only abnormal results are displayed) Labs Reviewed  BASIC METABOLIC PANEL - Abnormal; Notable for the following components:      Result Value   Sodium 133 (*)    Glucose, Bld 104 (*)    All other components within normal limits  CBC WITH DIFFERENTIAL/PLATELET - Abnormal; Notable for the following components:   MCV 74.7 (*)    MCH 25.1 (*)    All other components within normal limits  CK - Abnormal; Notable for the following components:   Total CK 410 (*)    All other components within normal limits    EKG None  Radiology No results found.  Procedures Procedures (including critical care time)  Medications Ordered in ED Medications - No data to display   Initial Impression / Assessment and Plan / ED Course  I have reviewed the triage vital signs and the nursing notes.  Pertinent labs & imaging results that were available during my care of the patient were reviewed by me and considered in my medical decision making (see chart for details).  Clinical Course as of Apr 25 947  Wed Apr 24, 7467  [id number] 38 year old male with what sounds like muscle cramps in his legs.  History of working outside in the heat.  Differential includes rhabdo, hypokalemia, dehydration.  Checking some screening labs.   [MB]  5027 Patient's CBC and electrolytes are okay.  Normal renal function.  CK is mildly elevated at 410 consistent with some muscle breakdown.  He clinically appears well-hydrated.  He is allergic to NSAIDs so we will put him on some muscle relaxant.   [MB]    Clinical Course User Index [MB] Hayden Rasmussen, MD        Final Clinical  Impressions(s) / ED Diagnoses   Final diagnoses:  Myalgia  Bilateral leg pain    ED Discharge Orders    None       Hayden Rasmussen, MD 04/25/19 [phone]

## 2019-04-24 NOTE — ED Triage Notes (Signed)
Bilateral leg pain x 1 days, he stated radiating from upper thighs to lower legs and up to both flanks area. Denis urinary symptoms nor Hx sciatica. Ambulated with steady gait .

## 2019-04-24 NOTE — Discharge Instructions (Signed)
You were seen in the emergency department for pain in your thighs that radiates up in your back in your lower leg.  This is likely muscular pain you may have to do with some muscle breakdown.  You had blood work that showed your kidney function and sugar were normal.  You will be important stay well-hydrated and drink electrolyte containing solutions like Gatorade.  We are also prescribing a muscle relaxant to help with the muscle soreness.  Please return to the emergency department if any worsening symptoms.

## 2019-04-25 ENCOUNTER — Encounter (HOSPITAL_BASED_OUTPATIENT_CLINIC_OR_DEPARTMENT_OTHER): Payer: Self-pay | Admitting: *Deleted

## 2019-04-25 ENCOUNTER — Emergency Department (HOSPITAL_BASED_OUTPATIENT_CLINIC_OR_DEPARTMENT_OTHER)
Admission: EM | Admit: 2019-04-25 | Discharge: 2019-04-25 | Disposition: A | Discharge status: Home / Self Care | Payer: Self-pay | Attending: Emergency Medicine | Admitting: Emergency Medicine

## 2019-04-25 ENCOUNTER — Other Ambulatory Visit: Payer: Self-pay

## 2019-04-25 DIAGNOSIS — R52 Pain, unspecified: Secondary | ICD-10-CM | POA: Insufficient documentation

## 2019-04-25 DIAGNOSIS — J45909 Unspecified asthma, uncomplicated: Secondary | ICD-10-CM | POA: Insufficient documentation

## 2019-04-25 LAB — BASIC METABOLIC PANEL
Anion gap: 8 (ref 5–15)
BUN: 17 mg/dL (ref 6–20)
CO2: 25 mmol/L (ref 22–32)
Calcium: 8.9 mg/dL (ref 8.9–10.3)
Chloride: 103 mmol/L (ref 98–111)
Creatinine, Ser: 0.92 mg/dL (ref 0.61–1.24)
GFR calc Af Amer: >60 mL/min (ref >60)
GFR calc non Af Amer: >60 mL/min (ref >60)
Glucose, Bld: 123 mg/dL — ABNORMAL HIGH (ref 70–99)
Potassium: 4.1 mmol/L (ref 3.5–5.1)
Sodium: 136 mmol/L (ref 135–145)

## 2019-04-25 LAB — CBC WITH DIFFERENTIAL/PLATELET
Abs Immature Granulocytes: 0.01 10*3/uL (ref 0.00–0.07)
Basophils Absolute: 0 10*3/uL (ref 0.0–0.1)
Basophils Relative: 0 %
Eosinophils Absolute: 0.1 10*3/uL (ref 0.0–0.5)
Eosinophils Relative: 2 %
HCT: 40.7 % (ref 39.0–52.0)
Hemoglobin: 13.7 g/dL (ref 13.0–17.0)
Immature Granulocytes: 0 %
Lymphocytes Relative: 29 %
Lymphs Abs: 1.8 10*3/uL (ref 0.7–4.0)
MCH: 25.3 pg — ABNORMAL LOW (ref 26.0–34.0)
MCHC: 33.7 g/dL (ref 30.0–36.0)
MCV: 75.2 fL — ABNORMAL LOW (ref 80.0–100.0)
Monocytes Absolute: 0.5 10*3/uL (ref 0.1–1.0)
Monocytes Relative: 9 %
Neutro Abs: 3.7 10*3/uL (ref 1.7–7.7)
Neutrophils Relative %: 60 %
Platelets: 271 10*3/uL (ref 150–400)
RBC: 5.41 MIL/uL (ref 4.22–5.81)
RDW: 14.5 % (ref 11.5–15.5)
WBC: 6.2 10*3/uL (ref 4.0–10.5)
nRBC: 0 % (ref 0.0–0.2)

## 2019-04-25 LAB — HEPATIC FUNCTION PANEL
ALT: 48 U/L — ABNORMAL HIGH (ref 0–44)
AST: 39 U/L (ref 15–41)
Albumin: 3.8 g/dL (ref 3.5–5.0)
Alkaline Phosphatase: 73 U/L (ref 38–126)
Bilirubin, Direct: <0.1 mg/dL (ref 0.0–0.2)
Total Bilirubin: 0.3 mg/dL (ref 0.3–1.2)
Total Protein: 7.2 g/dL (ref 6.5–8.1)

## 2019-04-25 LAB — MAGNESIUM: Magnesium: 2.1 mg/dL (ref 1.7–2.4)

## 2019-04-25 LAB — CK: Total CK: 236 U/L (ref 49–397)

## 2019-04-25 MED ORDER — SODIUM CHLORIDE 0.9 % IV BOLUS
1000.0000 mL | Freq: Once | INTRAVENOUS | Status: AC
  Administered 2019-04-25: 21:00:00 1000 mL via INTRAVENOUS

## 2019-04-25 MED ORDER — ACETAMINOPHEN 500 MG PO TABS
1000.0000 mg | ORAL_TABLET | Freq: Once | ORAL | Status: AC
  Administered 2019-04-25: 1000 mg via ORAL
  Filled 2019-04-25: qty 2

## 2019-04-25 NOTE — ED Triage Notes (Signed)
Joint pain. He was seen yesterday for similar symptoms but feels worse today.

## 2019-04-25 NOTE — ED Provider Notes (Signed)
[facility] Provider Note   CSN: [id number] Arrival date & time: 04/25/19  2016     History   Chief Complaint Chief Complaint  Patient presents with  . Joint Pain    HPI Jeffery Blackwell is a 38 y.o. male.     The history is provided by the patient.  Illness Location:  General Quality:  Aches Severity:  Mild Onset quality:  Gradual Duration:  2 days Timing:  Intermittent Progression:  Waxing and waning Chronicity:  New Context:  Generalized body aches and diffuse joint pain  Relieved by:  Tylenol  Worsened by:  Nothint Associated symptoms: myalgias   Associated symptoms: no abdominal pain, no chest pain, no cough, no ear pain, no fever, no rash, no shortness of breath, no sore throat and no vomiting     Past Medical History:  Diagnosis Date  . Asthma     Patient Active Problem List   Diagnosis Date Noted  . Mild concussion 07/12/2018    History reviewed. No pertinent surgical history.      Home Medications    Prior to Admission medications   Medication Sig Start Date End Date Taking? Authorizing Provider  cyclobenzaprine (FLEXERIL) 10 MG tablet Take 1 tablet (10 mg total) by mouth 2 (two) times daily as needed for up to 20 doses for muscle spasms. 04/01/19   Gretel Cantu, DO  methocarbamol (ROBAXIN) 500 MG tablet Take 2 tablets (1,000 mg total) by mouth every 8 (eight) hours as needed for muscle spasms. 04/24/19   Hayden Rasmussen, MD    Family History No family history on file.  Social History Social History   Tobacco Use  . Smoking status: Never Smoker  . Smokeless tobacco: Never Used  Substance Use Topics  . Alcohol use: Never    Frequency: Never  . Drug use: Never     Allergies   Ibuprofen   Review of Systems Review of Systems  Constitutional: Negative for chills and fever.  HENT: Negative for ear pain and sore throat.   Eyes: Negative for pain and visual disturbance.  Respiratory: Negative for  cough and shortness of breath.   Cardiovascular: Negative for chest pain and palpitations.  Gastrointestinal: Negative for abdominal pain and vomiting.  Genitourinary: Negative for dysuria and hematuria.  Musculoskeletal: Positive for arthralgias and myalgias. Negative for back pain, gait problem, joint swelling, neck pain and neck stiffness.  Skin: Negative for color change and rash.  Neurological: Negative for seizures and syncope.  All other systems reviewed and are negative.    Physical Exam Updated Vital Signs  ED Triage Vitals  Enc Vitals Group     BP 04/25/19 2019 135/88     Pulse Rate 04/25/19 2019 85     Resp 04/25/19 2019 12     Temp 04/25/19 2019 98.1 F (36.7 C)     Temp Source 04/25/19 2019 Oral     SpO2 04/25/19 2028 98 %     Weight 04/25/19 2020 300 lb (136.1 kg)     Height 04/25/19 2020 6' (1.829 m)     Head Circumference --      Peak Flow --      Pain Score 04/25/19 2024 7     Pain Loc --      Pain Edu? --      Excl. in [place]? --     Physical Exam Vitals signs and nursing note reviewed.  Constitutional:      General: He is not  in acute distress.    Appearance: He is well-developed. He is not ill-appearing.  HENT:     Head: Normocephalic and atraumatic.     Right Ear: Tympanic membrane normal.     Left Ear: Tympanic membrane normal.     Nose: Nose normal.     Mouth/Throat:     Mouth: Mucous membranes are moist.  Eyes:     Extraocular Movements: Extraocular movements intact.     Conjunctiva/sclera: Conjunctivae normal.     Pupils: Pupils are equal, round, and reactive to light.  Neck:     Musculoskeletal: Normal range of motion and neck supple.  Cardiovascular:     Rate and Rhythm: Normal rate and regular rhythm.     Pulses: Normal pulses.     Heart sounds: Normal heart sounds. No murmur.  Pulmonary:     Effort: Pulmonary effort is normal. No respiratory distress.     Breath sounds: Normal breath sounds.  Abdominal:     General: Abdomen is flat.  There is no distension.     Palpations: Abdomen is soft.     Tenderness: There is no abdominal tenderness.  Musculoskeletal: Normal range of motion.        General: No swelling, tenderness, deformity or signs of injury.     Right lower leg: No edema.     Left lower leg: No edema.  Skin:    General: Skin is warm and dry.     Capillary Refill: Capillary refill takes less than 2 seconds.  Neurological:     General: No focal deficit present.     Mental Status: He is alert.  Psychiatric:        Mood and Affect: Mood normal.      ED Treatments / Results  Labs (all labs ordered are listed, but only abnormal results are displayed) Labs Reviewed  CBC WITH DIFFERENTIAL/PLATELET - Abnormal; Notable for the following components:      Result Value   MCV 75.2 (*)    MCH 25.3 (*)    All other components within normal limits  BASIC METABOLIC PANEL - Abnormal; Notable for the following components:   Glucose, Bld 123 (*)    All other components within normal limits  HEPATIC FUNCTION PANEL - Abnormal; Notable for the following components:   ALT 48 (*)    All other components within normal limits  MAGNESIUM  CK    EKG None  Radiology No results found.  Procedures Procedures (including critical care time)  Medications Ordered in ED Medications  sodium chloride 0.9 % bolus 1,000 mL (1,000 mLs Intravenous New Bag/Given 04/25/19 2108)  acetaminophen (TYLENOL) tablet 1,000 mg (1,000 mg Oral Given 04/25/19 2057)     Initial Impression / Assessment and Plan / ED Course  I have reviewed the triage vital signs and the nursing notes.  Pertinent labs & imaging results that were available during my care of the patient were reviewed by me and considered in my medical decision making (see chart for details).       Jeffery Blackwell is a 38 year old male who comes back to the ED for body aches as similar to yesterday.  Denies any fever, chills.  Patient with normal vitals.  No fever.  Has  bilateral leg pain, pain in his joints.  Had mildly elevated CK yesterday.  Felt better after IV fluids.  Has not been taking his muscle relaxant or Tylenol today.  Repeat labs today show no significant electrolyte abnormality, kidney injury.  CK  is normal.  Possibly viral process.  Does not want to be tested for coronavirus.  Has no other infectious symptoms.  Possibly arthritis in nature.  Denies any GU symptoms.  Felt better after IV fluids and Tylenol.  Recommend follow-up with primary care doctor and discharged from ED in good condition.  This chart was dictated using voice recognition software.  Despite best efforts to proofread,  errors can occur which can change the documentation meaning.    Final Clinical Impressions(s) / ED Diagnoses   Final diagnoses:  Generalized body aches    ED Discharge Orders    None       Virgina NorfolkCuratolo, Demetria Iwai, DO 04/25/19 2158

## 2019-12-12 ENCOUNTER — Emergency Department (HOSPITAL_BASED_OUTPATIENT_CLINIC_OR_DEPARTMENT_OTHER): Payer: Self-pay

## 2019-12-12 ENCOUNTER — Emergency Department (HOSPITAL_BASED_OUTPATIENT_CLINIC_OR_DEPARTMENT_OTHER)
Admission: EM | Admit: 2019-12-12 | Discharge: 2019-12-12 | Disposition: A | Discharge status: Home / Self Care | Payer: Self-pay | Attending: Emergency Medicine | Admitting: Emergency Medicine

## 2019-12-12 ENCOUNTER — Other Ambulatory Visit: Payer: Self-pay

## 2019-12-12 ENCOUNTER — Encounter (HOSPITAL_BASED_OUTPATIENT_CLINIC_OR_DEPARTMENT_OTHER): Payer: Self-pay

## 2019-12-12 DIAGNOSIS — J45909 Unspecified asthma, uncomplicated: Secondary | ICD-10-CM | POA: Insufficient documentation

## 2019-12-12 DIAGNOSIS — X509XXA Other and unspecified overexertion or strenuous movements or postures, initial encounter: Secondary | ICD-10-CM | POA: Insufficient documentation

## 2019-12-12 DIAGNOSIS — Z886 Allergy status to analgesic agent status: Secondary | ICD-10-CM | POA: Insufficient documentation

## 2019-12-12 DIAGNOSIS — Y939 Activity, unspecified: Secondary | ICD-10-CM | POA: Insufficient documentation

## 2019-12-12 DIAGNOSIS — Y999 Unspecified external cause status: Secondary | ICD-10-CM | POA: Insufficient documentation

## 2019-12-12 DIAGNOSIS — M79671 Pain in right foot: Secondary | ICD-10-CM

## 2019-12-12 DIAGNOSIS — Y929 Unspecified place or not applicable: Secondary | ICD-10-CM | POA: Insufficient documentation

## 2019-12-12 DIAGNOSIS — S93401A Sprain of unspecified ligament of right ankle, initial encounter: Secondary | ICD-10-CM | POA: Insufficient documentation

## 2019-12-12 NOTE — Discharge Instructions (Addendum)
Your xrays did not show any breaks. You may be experiencing pain related to a sprain. Please use brace and crutches to ambulate. While at home please rest, ice, and elevate your foot on 2-3 pillows to reduce swelling/inflammation. You may take Tylenol as needed for pain. Follow up with Dr. Jordan Likes for further evaluation.

## 2019-12-12 NOTE — ED Provider Notes (Signed)
[facility] Provider Note   CSN: [id number] Arrival date & time: 12/12/19  1628     History Chief Complaint  Patient presents with  . Ankle Injury    Jeffery Blackwell is a 39 y.o. male with PMHx asthma who presents to the ED today with complaint of sudden onset, constant, worsening, R ankle/foot pain s/p mechanical fall that occurred 4 days ago.  She reports that he was standing on a slab of concrete which crashed causing him to invert his ankle and fall on the ground.  No head injury or loss of consciousness.  Patient reports he has been having pain specifically to the medial aspect of his ankle and foot and has therefore been ambulating on the lateral aspect now causing pain to the lateral aspect.  He has not been taking anything for pain.  He does report previous injury to his right first MTP joint a few years ago with pinning in place. Denies fevers, chills, weakness, numbness, tingling, or any other associated symptoms.  The history is provided by the patient and medical records.       Past Medical History:  Diagnosis Date  . Asthma     Patient Active Problem List   Diagnosis Date Noted  . Mild concussion 07/12/2018    History reviewed. No pertinent surgical history.     No family history on file.  Social History   Tobacco Use  . Smoking status: Never Smoker  . Smokeless tobacco: Never Used  Substance Use Topics  . Alcohol use: Never  . Drug use: Never    Home Medications Prior to Admission medications   Medication Sig Start Date End Date Taking? Authorizing Provider  cyclobenzaprine (FLEXERIL) 10 MG tablet Take 1 tablet (10 mg total) by mouth 2 (two) times daily as needed for up to 20 doses for muscle spasms. 04/01/19   Curatolo, Adam, DO  methocarbamol (ROBAXIN) 500 MG tablet Take 2 tablets (1,000 mg total) by mouth every 8 (eight) hours as needed for muscle spasms. 04/24/19   Hayden Rasmussen, MD    Allergies    Ibuprofen   Review of Systems   Review of Systems  Constitutional: Negative for chills and fever.  Musculoskeletal: Positive for arthralgias.  Neurological: Negative for syncope and headaches.    Physical Exam Updated Vital Signs BP (!) 149/91 (BP Location: Left Arm)   Pulse 66   Temp 98.2 F (36.8 C) (Oral)   Resp 18   Ht 6' (1.829 m)   Wt (!) 137.4 kg   SpO2 100%   BMI 41.09 kg/m   Physical Exam Vitals and nursing note reviewed.  Constitutional:      Appearance: He is not ill-appearing.  HENT:     Head: Normocephalic and atraumatic.  Eyes:     Conjunctiva/sclera: Conjunctivae normal.  Cardiovascular:     Rate and Rhythm: Normal rate and regular rhythm.  Pulmonary:     Effort: Pulmonary effort is normal.     Breath sounds: Normal breath sounds.  Musculoskeletal:     Comments: Mild swelling noted to dorsum of R foot; TTP to medial aspect along 1st-3rd metatarsals as well as medial malleolus; ROM intact to ankle with dorsiflexion/plantarflexion as well as toes; 2+ DP pulse; cap refill < 2 seconds  Skin:    General: Skin is warm and dry.     Coloration: Skin is not jaundiced.  Neurological:     Mental Status: He is alert.     ED  Results / Procedures / Treatments   Labs (all labs ordered are listed, but only abnormal results are displayed) Labs Reviewed - No data to display  EKG None  Radiology DG Ankle Complete Right  Result Date: 12/12/2019 CLINICAL DATA:  39 year old male with medial ankle pain. EXAM: RIGHT ANKLE - COMPLETE 3+ VIEW; RIGHT FOOT COMPLETE - 3+ VIEW COMPARISON:  None. FINDINGS: There is no acute fracture or dislocation. The bones are well mineralized. No arthritic changes. Ankle mortise is intact. The soft tissues are unremarkable. IMPRESSION: Negative. Electronically Signed   By: Elgie Collard M.D.   On: 12/12/2019 17:18   DG Foot Complete Right  Result Date: 12/12/2019 CLINICAL DATA:  39 year old male with medial ankle pain. EXAM: RIGHT ANKLE - COMPLETE  3+ VIEW; RIGHT FOOT COMPLETE - 3+ VIEW COMPARISON:  None. FINDINGS: There is no acute fracture or dislocation. The bones are well mineralized. No arthritic changes. Ankle mortise is intact. The soft tissues are unremarkable. IMPRESSION: Negative. Electronically Signed   By: Elgie Collard M.D.   On: 12/12/2019 17:18    Procedures Procedures (including critical care time)  Medications Ordered in ED Medications - No data to display  ED Course  I have reviewed the triage vital signs and the nursing notes.  Pertinent labs & imaging results that were available during my care of the patient were reviewed by me and considered in my medical decision making (see chart for details).    MDM Rules/Calculators/A&P                      39 year old male who presents to the ED today complaining of sudden onset ankle and foot pain on the right status post mechanical fall 4 days ago where he inverted his ankle.  He has some mild swelling noted to the dorsum of his right foot with tenderness to palpation along the metatarsals of the first through third metatarsals as well as medial malleolus. He is NVI. Will obtain xrays and reevaluate.   Xrays negative. Pt has been ambulating on the lateral aspect of his foot to avoid the pain. Will apply ankle aso brace and crutches and have pt follow up with sports med for further eval. He is instructed on RICE therapy as well as Tylenol PRN for pain. Pt has allergy listed to Ibuprofen. Pt is in agreement with plan and stable for discharge home.   This note was prepared using Dragon voice recognition software and may include unintentional dictation errors due to the inherent limitations of voice recognition software.  Final Clinical Impression(s) / ED Diagnoses Final diagnoses:  Sprain of right ankle, unspecified ligament, initial encounter  Right foot pain    Rx / DC Orders ED Discharge Orders    None       Discharge Instructions     Your xrays did not show  any breaks. You may be experiencing pain related to a sprain. Please use brace and crutches to ambulate. While at home please rest, ice, and elevate your foot on 2-3 pillows to reduce swelling/inflammation. You may take Tylenol as needed for pain. Follow up with Dr. Jordan Likes for further evaluation.          Tanda Rockers, PA-C 12/12/19 1731    Geoffery Lyons, MD 12/12/19 [phone]

## 2019-12-12 NOTE — ED Triage Notes (Signed)
Pt c/o twisted right ankle 4/4-NAD-steady gait

## 2020-03-20 ENCOUNTER — Other Ambulatory Visit: Payer: Self-pay

## 2020-03-20 ENCOUNTER — Ambulatory Visit
Admission: EM | Admit: 2020-03-20 | Discharge: 2020-03-20 | Disposition: A | Discharge status: Home / Self Care | Payer: Self-pay | Attending: Emergency Medicine | Admitting: Emergency Medicine

## 2020-03-20 DIAGNOSIS — J069 Acute upper respiratory infection, unspecified: Secondary | ICD-10-CM

## 2020-03-20 DIAGNOSIS — J4521 Mild intermittent asthma with (acute) exacerbation: Secondary | ICD-10-CM

## 2020-03-20 MED ORDER — ALBUTEROL SULFATE HFA 108 (90 BASE) MCG/ACT IN AERS: 2.0000 | INHALATION_SPRAY | RESPIRATORY_TRACT | 0 refills | Status: DC | PRN

## 2020-03-20 MED ORDER — AEROCHAMBER PLUS FLO-VU MEDIUM MISC: 1.0000 | Freq: Once | 0 refills | Status: AC

## 2020-03-20 MED ORDER — PREDNISONE 50 MG PO TABS: 50.0000 mg | ORAL_TABLET | Freq: Every day | ORAL | 0 refills | Status: DC

## 2020-03-20 MED ORDER — FLUTICASONE PROPIONATE 50 MCG/ACT NA SUSP: 1.0000 | Freq: Every day | NASAL | 0 refills | Status: DC

## 2020-03-20 MED ORDER — CETIRIZINE HCL 10 MG PO TABS: 10.0000 mg | ORAL_TABLET | Freq: Every day | ORAL | 0 refills | Status: DC

## 2020-03-20 MED ORDER — BENZONATATE 100 MG PO CAPS: 100.0000 mg | ORAL_CAPSULE | Freq: Three times a day (TID) | ORAL | 0 refills | Status: DC

## 2020-03-20 NOTE — ED Provider Notes (Signed)
[name]    CSN: [id number] Arrival date & time: 03/20/20  1355      History   Chief Complaint Chief Complaint  Patient presents with  . Asthma    HPI Jeffery Blackwell is a 39 y.o. male with history of asthma presenting for asthma flares x3 days.  Patient provides history: Endorsing dry cough, wheezing, chest tightness.  No palpitations, nausea, vomiting, fever, thrashes, myalgias.  Has not received Covid vaccine, denies known sick contacts.  Has run out of his inhaler: Requesting refill.   Past Medical History:  Diagnosis Date  . Asthma     Patient Active Problem List   Diagnosis Date Noted  . Mild concussion 07/12/2018    History reviewed. No pertinent surgical history.     Home Medications    Prior to Admission medications   Medication Sig Start Date End Date Taking? Authorizing Provider  albuterol (VENTOLIN HFA) 108 (90 Base) MCG/ACT inhaler Inhale 2 puffs into the lungs every 4 (four) hours as needed for wheezing or shortness of breath. 03/20/20   Hall-Potvin, [place], PA-C  benzonatate (TESSALON) 100 MG capsule Take 1 capsule (100 mg total) by mouth every 8 (eight) hours. 03/20/20   Hall-Potvin, [place], PA-C  cetirizine (ZYRTEC ALLERGY) 10 MG tablet Take 1 tablet (10 mg total) by mouth daily. 03/20/20   Hall-Potvin, [place], PA-C  fluticasone (FLONASE) 50 MCG/ACT nasal spray Place 1 spray into both nostrils daily. 03/20/20   Hall-Potvin, [place], PA-C  predniSONE (DELTASONE) 50 MG tablet Take 1 tablet (50 mg total) by mouth daily with breakfast. 03/20/20   Hall-Potvin, [place], PA-C  Spacer/Aero-Holding Chambers (AEROCHAMBER PLUS FLO-VU MEDIUM) MISC 1 each by Other route once for 1 dose. 03/20/20 03/20/20  Hall-Potvin, [place], PA-C    Family History History reviewed. No pertinent family history.  Social History Social History   Tobacco Use  . Smoking status: Never Smoker  . Smokeless tobacco: Never Used  Vaping Use  . Vaping Use: Never  used  Substance Use Topics  . Alcohol use: Never  . Drug use: Never     Allergies   Ibuprofen   Review of Systems As per HPI   Physical Exam Triage Vital Signs ED Triage Vitals  Enc Vitals Group     BP 03/20/20 1410 133/86     Pulse Rate 03/20/20 1410 76     Resp 03/20/20 1410 20     Temp 03/20/20 1410 98.1 F (36.7 C)     Temp Source 03/20/20 1410 Oral     SpO2 03/20/20 1410 96 %     Weight --      Height --      Head Circumference --      Peak Flow --      Pain Score 03/20/20 1413 0     Pain Loc --      Pain Edu? --      Excl. in [place]? --    No data found.  Updated Vital Signs BP 133/86 (BP Location: Left Arm)   Pulse 76   Temp 98.1 F (36.7 C) (Oral)   Resp 20   SpO2 96%   Visual Acuity Right Eye Distance:   Left Eye Distance:   Bilateral Distance:    Right Eye Near:   Left Eye Near:    Bilateral Near:     Physical Exam Constitutional:      General: He is not in acute distress.    Appearance: He is not toxic-appearing or diaphoretic.  HENT:     Head: Normocephalic and atraumatic.     Mouth/Throat:     Mouth: Mucous membranes are moist.     Pharynx: Oropharynx is clear.  Eyes:     General: No scleral icterus.    Conjunctiva/sclera: Conjunctivae normal.     Pupils: Pupils are equal, round, and reactive to light.  Neck:     Comments: Trachea midline, negative JVD Cardiovascular:     Rate and Rhythm: Normal rate and regular rhythm.  Pulmonary:     Effort: Pulmonary effort is normal. No respiratory distress.     Breath sounds: Wheezing present. No rhonchi or rales.     Comments: Mild wheezing without prolonged expiratory phase.  Good air entry bilaterally Musculoskeletal:     Cervical back: Neck supple. No tenderness.  Lymphadenopathy:     Cervical: No cervical adenopathy.  Skin:    Capillary Refill: Capillary refill takes less than 2 seconds.     Coloration: Skin is not jaundiced or pale.     Findings: No rash.  Neurological:     Mental  Status: He is alert and oriented to person, place, and time.      UC Treatments / Results  Labs (all labs ordered are listed, but only abnormal results are displayed) Labs Reviewed - No data to display  EKG   Radiology No results found.  Procedures Procedures (including critical care time)  Medications Ordered in UC Medications - No data to display  Initial Impression / Assessment and Plan / UC Course  I have reviewed the triage vital signs and the nursing notes.  Pertinent labs & imaging results that were available during my care of the patient were reviewed by me and considered in my medical decision making (see chart for details).     Patient afebrile, nontoxic, with SpO2 96%.  Patient declined Covid testing at this time.  Likely asthma flare second to URI: We will treat supportively as outlined below.  Return precautions discussed, patient verbalized understanding and is agreeable to plan. Final Clinical Impressions(s) / UC Diagnoses   Final diagnoses:  URI with cough and congestion  Mild intermittent asthma with acute exacerbation     Discharge Instructions     Tessalon for cough. Start flonase, atrovent nasal spray for nasal congestion/drainage. You can use over the counter nasal saline rinse such as neti pot for nasal congestion. Keep hydrated, your urine should be clear to pale yellow in color. Tylenol/motrin for fever and pain. Monitor for any worsening of symptoms, chest pain, shortness of breath, wheezing, swelling of the throat, go to the emergency department for further evaluation needed.     ED Prescriptions    Medication Sig Dispense Auth. Provider   albuterol (VENTOLIN HFA) 108 (90 Base) MCG/ACT inhaler Inhale 2 puffs into the lungs every 4 (four) hours as needed for wheezing or shortness of breath. 18 g Hall-Potvin, [place], PA-C   Spacer/Aero-Holding Chambers (AEROCHAMBER PLUS FLO-VU MEDIUM) MISC 1 each by Other route once for 1 dose. 1 each  Hall-Potvin, [place], PA-C   benzonatate (TESSALON) 100 MG capsule Take 1 capsule (100 mg total) by mouth every 8 (eight) hours. 21 capsule Hall-Potvin, [place], PA-C   predniSONE (DELTASONE) 50 MG tablet Take 1 tablet (50 mg total) by mouth daily with breakfast. 5 tablet Hall-Potvin, [place], PA-C   cetirizine (ZYRTEC ALLERGY) 10 MG tablet Take 1 tablet (10 mg total) by mouth daily. 30 tablet Hall-Potvin, [place], PA-C   fluticasone (FLONASE) 50 MCG/ACT nasal spray Place  1 spray into both nostrils daily. 16 g Hall-Potvin, [place], PA-C     PDMP not reviewed this encounter.   Hall-Potvin, [place], [place] 03/20/20 1530

## 2020-03-20 NOTE — ED Triage Notes (Signed)
Pt requesting refill on asthma medications, states he needs an inhaler, neb machine and neb treatments

## 2020-03-20 NOTE — Discharge Instructions (Signed)

## 2020-04-05 ENCOUNTER — Other Ambulatory Visit: Payer: Self-pay

## 2020-04-05 ENCOUNTER — Encounter (HOSPITAL_BASED_OUTPATIENT_CLINIC_OR_DEPARTMENT_OTHER): Payer: Self-pay

## 2020-04-05 ENCOUNTER — Emergency Department (HOSPITAL_BASED_OUTPATIENT_CLINIC_OR_DEPARTMENT_OTHER)
Admission: EM | Admit: 2020-04-05 | Discharge: 2020-04-06 | Disposition: A | Discharge status: Home / Self Care | Payer: Self-pay | Attending: Emergency Medicine | Admitting: Emergency Medicine

## 2020-04-05 DIAGNOSIS — Z7951 Long term (current) use of inhaled steroids: Secondary | ICD-10-CM | POA: Insufficient documentation

## 2020-04-05 DIAGNOSIS — R369 Urethral discharge, unspecified: Secondary | ICD-10-CM | POA: Insufficient documentation

## 2020-04-05 DIAGNOSIS — J45909 Unspecified asthma, uncomplicated: Secondary | ICD-10-CM | POA: Insufficient documentation

## 2020-04-05 DIAGNOSIS — Z711 Person with feared health complaint in whom no diagnosis is made: Secondary | ICD-10-CM

## 2020-04-05 LAB — URINALYSIS, ROUTINE W REFLEX MICROSCOPIC
Bilirubin Urine: NEGATIVE
Glucose, UA: NEGATIVE mg/dL
Ketones, ur: NEGATIVE mg/dL
Nitrite: NEGATIVE
Protein, ur: NEGATIVE mg/dL
Specific Gravity, Urine: >1.03 — ABNORMAL HIGH (ref 1.005–1.030)
pH: 5.5 (ref 5.0–8.0)

## 2020-04-05 LAB — URINALYSIS, MICROSCOPIC (REFLEX): WBC, UA: >50 WBC/hpf (ref 0–5)

## 2020-04-05 MED ORDER — CEFTRIAXONE SODIUM 500 MG IJ SOLR
500.0000 mg | Freq: Once | INTRAMUSCULAR | Status: AC
  Administered 2020-04-05: 500 mg via INTRAMUSCULAR
  Filled 2020-04-05: qty 500

## 2020-04-05 MED ORDER — AZITHROMYCIN 250 MG PO TABS
1000.0000 mg | ORAL_TABLET | Freq: Once | ORAL | Status: AC
  Administered 2020-04-05: 1000 mg via ORAL
  Filled 2020-04-05: qty 4

## 2020-04-05 MED ORDER — LIDOCAINE HCL (PF) 1 % IJ SOLN
INTRAMUSCULAR | Status: AC
  Administered 2020-04-05: 2 mL
  Filled 2020-04-05: qty 5

## 2020-04-05 NOTE — ED Triage Notes (Signed)
Pt c/o penile discharge x1 day. Pt reports having unprotected sex.

## 2020-04-06 MED ORDER — DOXYCYCLINE HYCLATE 100 MG PO CAPS: 100.0000 mg | ORAL_CAPSULE | Freq: Two times a day (BID) | ORAL | 0 refills | Status: DC

## 2020-04-06 NOTE — ED Provider Notes (Signed)
[facility] EMERGENCY DEPARTMENT Provider Note   CSN: [id number] Arrival date & time: 04/05/20  2210     History Chief Complaint  Patient presents with  . Penile Discharge    Jeffery Blackwell is a 39 y.o. male.  HPI     This is a 39 year old male who presents with concern for STD.  Patient reports over the last 1 to 2 days he has noted "leaking from my penis."  He describes discharge.  Denies dysuria.  He has a remote history of STDs.  He states that he most recently did not use condoms during sexual intercourse.  He reports 1 sexual partner.  Denies any penile lesions.  Denies fevers or systemic symptoms.  Past Medical History:  Diagnosis Date  . Asthma     Patient Active Problem List   Diagnosis Date Noted  . Mild concussion 07/12/2018    Past Surgical History:  Procedure Laterality Date  . FOOT SURGERY         No family history on file.  Social History   Tobacco Use  . Smoking status: Never Smoker  . Smokeless tobacco: Never Used  Vaping Use  . Vaping Use: Never used  Substance Use Topics  . Alcohol use: Never  . Drug use: Never    Home Medications Prior to Admission medications   Medication Sig Start Date End Date Taking? Authorizing Provider  albuterol (VENTOLIN HFA) 108 (90 Base) MCG/ACT inhaler Inhale 2 puffs into the lungs every 4 (four) hours as needed for wheezing or shortness of breath. 03/20/20   Hall-Potvin, [place], PA-C  benzonatate (TESSALON) 100 MG capsule Take 1 capsule (100 mg total) by mouth every 8 (eight) hours. 03/20/20   Hall-Potvin, [place], PA-C  cetirizine (ZYRTEC ALLERGY) 10 MG tablet Take 1 tablet (10 mg total) by mouth daily. 03/20/20   Hall-Potvin, [place], PA-C  doxycycline (VIBRAMYCIN) 100 MG capsule Take 1 capsule (100 mg total) by mouth 2 (two) times daily. 04/06/20   Roshawnda Pecora, Mayer Masker, MD  fluticasone (FLONASE) 50 MCG/ACT nasal spray Place 1 spray into both nostrils daily. 03/20/20   Hall-Potvin, [place], PA-C    predniSONE (DELTASONE) 50 MG tablet Take 1 tablet (50 mg total) by mouth daily with breakfast. 03/20/20   Hall-Potvin, [place], PA-C    Allergies    Ibuprofen  Review of Systems   Review of Systems  Constitutional: Negative for fever.  Gastrointestinal: Negative for abdominal pain.  Genitourinary: Positive for discharge. Negative for penile pain, penile swelling, scrotal swelling and testicular pain.  All other systems reviewed and are negative.   Physical Exam Updated Vital Signs BP (!) 136/76 (BP Location: Right Arm)   Pulse 79   Temp 98.8 F (37.1 C) (Oral)   Resp 20   Ht 1.829 m (6')   Wt (!) 127 kg   SpO2 97%   BMI 37.97 kg/m   Physical Exam Vitals and nursing note reviewed.  Constitutional:      Appearance: He is well-developed. He is obese. He is not ill-appearing.  HENT:     Head: Normocephalic and atraumatic.     Mouth/Throat:     Mouth: Mucous membranes are moist.  Eyes:     Pupils: Pupils are equal, round, and reactive to light.  Cardiovascular:     Rate and Rhythm: Normal rate and regular rhythm.  Pulmonary:     Effort: Pulmonary effort is normal. No respiratory distress.  Genitourinary:    Comments: Deferred Musculoskeletal:     Cervical back:  Neck supple.  Skin:    General: Skin is warm and dry.  Neurological:     Mental Status: He is alert and oriented to person, place, and time.  Psychiatric:        Mood and Affect: Mood normal.     ED Results / Procedures / Treatments   Labs (all labs ordered are listed, but only abnormal results are displayed) Labs Reviewed  URINALYSIS, ROUTINE W REFLEX MICROSCOPIC - Abnormal; Notable for the following components:      Result Value   APPearance CLOUDY (*)    Specific Gravity, Urine >1.030 (*)    Hgb urine dipstick TRACE (*)    Leukocytes,Ua SMALL (*)    All other components within normal limits  URINALYSIS, MICROSCOPIC (REFLEX) - Abnormal; Notable for the following components:   Bacteria, UA FEW  (*)    All other components within normal limits  GC/CHLAMYDIA PROBE AMP ([place]) NOT AT [facility]    EKG None  Radiology No results found.  Procedures Procedures (including critical care time)  Medications Ordered in ED Medications  cefTRIAXone (ROCEPHIN) injection 500 mg (500 mg Intramuscular Given 04/05/20 2352)  azithromycin (ZITHROMAX) tablet 1,000 mg (1,000 mg Oral Given 04/05/20 2352)  lidocaine (PF) (XYLOCAINE) 1 % injection (2 mLs  Given 04/05/20 2353)    ED Course  I have reviewed the triage vital signs and the nursing notes.  Pertinent labs & imaging results that were available during my care of the patient were reviewed by me and considered in my medical decision making (see chart for details).    MDM Rules/Calculators/A&P                          Patient presents with concern for STD.  Describes penile discharge.  He is overall nontoxic and vital signs are reassuring.  Urinalysis reviewed.  He has greater than 50 white cells and few bacteria.  In a young otherwise healthy male, this is likely representative STDs given his concern.  He was given IM Rocephin and azithromycin.  He will be discharged on doxycycline.  I discussed with him abstinence for the next 10 to 14 days and counseled him regarding safe sex practices.  After history, exam, and medical workup I feel the patient has been appropriately medically screened and is safe for discharge home. Pertinent diagnoses were discussed with the patient. Patient was given return precautions.  Final Clinical Impression(s) / ED Diagnoses Final diagnoses:  Penile discharge  Concern about STD in male without diagnosis    Rx / DC Orders ED Discharge Orders         Ordered    doxycycline (VIBRAMYCIN) 100 MG capsule  2 times daily     Discontinue  Reprint     04/06/20 0003           Shon Baton, MD 04/06/20 0006

## 2020-04-06 NOTE — Discharge Instructions (Signed)
You were seen today with concerns for STD.  You were tested and treated.  Take medications as prescribed.  Abstain from sexual activity for the next 10 to 14 days.  Always use condoms.

## 2020-04-07 LAB — GC/CHLAMYDIA PROBE AMP (~~LOC~~) NOT AT ARMC
Chlamydia: NEGATIVE
Comment: NEGATIVE
Comment: NORMAL
Neisseria Gonorrhea: POSITIVE — AB

## 2020-06-17 ENCOUNTER — Emergency Department (HOSPITAL_BASED_OUTPATIENT_CLINIC_OR_DEPARTMENT_OTHER)
Admission: EM | Admit: 2020-06-17 | Discharge: 2020-06-17 | Disposition: A | Discharge status: Home / Self Care | Payer: Self-pay | Attending: Emergency Medicine | Admitting: Emergency Medicine

## 2020-06-17 ENCOUNTER — Other Ambulatory Visit: Payer: Self-pay

## 2020-06-17 ENCOUNTER — Encounter (HOSPITAL_BASED_OUTPATIENT_CLINIC_OR_DEPARTMENT_OTHER): Payer: Self-pay | Admitting: Emergency Medicine

## 2020-06-17 DIAGNOSIS — J029 Acute pharyngitis, unspecified: Secondary | ICD-10-CM | POA: Diagnosis not present

## 2020-06-17 DIAGNOSIS — R519 Headache, unspecified: Secondary | ICD-10-CM | POA: Diagnosis present

## 2020-06-17 DIAGNOSIS — J45909 Unspecified asthma, uncomplicated: Secondary | ICD-10-CM | POA: Insufficient documentation

## 2020-06-17 DIAGNOSIS — Z20822 Contact with and (suspected) exposure to covid-19: Secondary | ICD-10-CM | POA: Insufficient documentation

## 2020-06-17 DIAGNOSIS — B349 Viral infection, unspecified: Secondary | ICD-10-CM | POA: Insufficient documentation

## 2020-06-17 LAB — RESPIRATORY PANEL BY RT PCR (FLU A&B, COVID)
Influenza A by PCR: NEGATIVE
Influenza B by PCR: NEGATIVE
SARS Coronavirus 2 by RT PCR: NEGATIVE

## 2020-06-17 MED ORDER — DEXAMETHASONE 6 MG PO TABS
10.0000 mg | ORAL_TABLET | Freq: Once | ORAL | Status: AC
  Administered 2020-06-17: 10 mg via ORAL
  Filled 2020-06-17: qty 1

## 2020-06-17 NOTE — ED Provider Notes (Signed)
[facility] Emergency Department Provider Note MRN:  [id number]  Arrival date & time: 06/17/20     Chief Complaint   Burn and Headache   History of Present Illness   Tymel Conely is a 39 y.o. year-old male with no pertinent past medical history presenting to the ED with chief complaint of burn.  Patient burned himself on a hot piece of equipment at work last week.  Few days later began experiencing subjective fever, headaches, sore throat.  Sent here by employer to be tested for Covid.  No chest pain, no shortness of breath, no abdominal pain, no numbness or weakness to the arms or legs.  Review of Systems  A complete 10 system review of systems was obtained and all systems are negative except as noted in the HPI and PMH.   Patient's Health History    Past Medical History:  Diagnosis Date  . Asthma     Past Surgical History:  Procedure Laterality Date  . FOOT SURGERY      No family history on file.  Social History   Socioeconomic History  . Marital status: Single    Spouse name: Not on file  . Number of children: Not on file  . Years of education: Not on file  . Highest education level: Not on file  Occupational History  . Not on file  Tobacco Use  . Smoking status: Never Smoker  . Smokeless tobacco: Never Used  Vaping Use  . Vaping Use: Never used  Substance and Sexual Activity  . Alcohol use: Never  . Drug use: Never  . Sexual activity: Not on file  Other Topics Concern  . Not on file  Social History Narrative  . Not on file   Social Determinants of Health   Financial Resource Strain:   . Difficulty of Paying Living Expenses: Not on file  Food Insecurity:   . Worried About Programme researcher, broadcasting/film/video in the Last Year: Not on file  . Ran Out of Food in the Last Year: Not on file  Transportation Needs:   . Lack of Transportation (Medical): Not on file  . Lack of Transportation (Non-Medical): Not on file  Physical Activity:   .  Days of Exercise per Week: Not on file  . Minutes of Exercise per Session: Not on file  Stress:   . Feeling of Stress : Not on file  Social Connections:   . Frequency of Communication with Friends and Family: Not on file  . Frequency of Social Gatherings with Friends and Family: Not on file  . Attends Religious Services: Not on file  . Active Member of Clubs or Organizations: Not on file  . Attends Banker Meetings: Not on file  . Marital Status: Not on file  Intimate Partner Violence:   . Fear of Current or Ex-Partner: Not on file  . Emotionally Abused: Not on file  . Physically Abused: Not on file  . Sexually Abused: Not on file     Physical Exam   Vitals:   06/17/20 1209  BP: 130/89  Pulse: 79  Resp: 18  Temp: 98.1 F (36.7 C)  SpO2: 98%    CONSTITUTIONAL: Well-appearing, NAD NEURO:  Alert and oriented x 3, no focal deficits EYES:  eyes equal and reactive ENT/NECK:  no LAD, no JVD CARDIO: Regular rate, well-perfused, normal S1 and S2 PULM:  CTAB no wheezing or rhonchi GI/GU:  normal bowel sounds, non-distended, non-tender MSK/SPINE:  No gross deformities,  no edema SKIN: 7 cm rectangular area healing and scabbed burn to the left anterior forearm PSYCH:  Appropriate speech and behavior  *Additional and/or pertinent findings included in MDM below  Diagnostic and Interventional Summary    EKG Interpretation  Date/Time:    Ventricular Rate:    PR Interval:    QRS Duration:   QT Interval:    QTC Calculation:   R Axis:     Text Interpretation:        Labs Reviewed  RESPIRATORY PANEL BY RT PCR (FLU A&B, COVID)    No orders to display    Medications  dexamethasone (DECADRON) tablet 10 mg (10 mg Oral Given 06/17/20 1356)     Procedures  /  Critical Care Procedures  ED Course and Medical Decision Making  I have reviewed the triage vital signs, the nursing notes, and pertinent available records from the EMR.  Listed above are laboratory and  imaging tests that I personally ordered, reviewed, and interpreted and then considered in my medical decision making (see below for details).  Burn is well-healing, no evidence of infection or deeper injury, neurovascularly intact.  Patient is exhibiting viral symptoms, will test for Covid.  Posterior oropharynx with scattered vesicles, suspect echovirus.  No asymmetry or findings to suggest significant contiguous infection, normal vital signs, no fever.  Aroldo Galli was evaluated in Emergency Department on 06/17/2020 for the symptoms described in the history of present illness. He was evaluated in the context of the global COVID-19 pandemic, which necessitated consideration that the patient might be at risk for infection with the SARS-CoV-2 virus that causes COVID-19. Institutional protocols and algorithms that pertain to the evaluation of patients at risk for COVID-19 are in a state of rapid change based on information released by regulatory bodies including the CDC and federal and state organizations. These policies and algorithms were followed during the patient's care in the ED.        Elmer Sow. Pilar Plate, MD [facility] Emergency Medicine [facility] [email or web address]  Final Clinical Impressions(s) / ED Diagnoses     ICD-10-CM   1. Viral illness  B34.9     ED Discharge Orders    None       Discharge Instructions Discussed with and Provided to Patient:     Discharge Instructions     You were evaluated in the Emergency Department and after careful evaluation, we did not find any emergent condition requiring admission or further testing in the hospital.  Your exam/testing today was overall reassuring.  We suspect your symptoms are due to a viral illness.  We have tested you for Covid here in the emergency department and you should practice home isolation until you receive a negative test result.  If positive, you should isolate at home for 10 days per [facility]  recommendations.  We recommend Tylenol or Motrin at home for discomfort.  Your burn has been well treated and is healing, we recommend use of Neosporin daily to help with scarring.  Please return to the Emergency Department if you experience any worsening of your condition.  Thank you for allowing [place] to be a part of your care.       Sabas Sous, MD 06/17/20 [id number]

## 2020-06-17 NOTE — Discharge Instructions (Addendum)
You were evaluated in the Emergency Department and after careful evaluation, we did not find any emergent condition requiring admission or further testing in the hospital.  Your exam/testing today was overall reassuring.  We suspect your symptoms are due to a viral illness.  We have tested you for Covid here in the emergency department and you should practice home isolation until you receive a negative test result.  If positive, you should isolate at home for 10 days per [facility] recommendations.  We recommend Tylenol or Motrin at home for discomfort.  Your burn has been well treated and is healing, we recommend use of Neosporin daily to help with scarring.  Please return to the Emergency Department if you experience any worsening of your condition.  Thank you for allowing [place] to be a part of your care.

## 2020-06-17 NOTE — ED Triage Notes (Signed)
He burned his L arm at work last week. States he then started feeling bad with R ear pain and headache.

## 2020-06-29 ENCOUNTER — Ambulatory Visit
Admission: EM | Admit: 2020-06-29 | Discharge: 2020-06-29 | Discharge status: Absent without leave | Payer: No Typology Code available for payment source

## 2020-09-16 ENCOUNTER — Other Ambulatory Visit: Payer: Self-pay

## 2020-09-16 ENCOUNTER — Encounter (HOSPITAL_COMMUNITY): Payer: Self-pay | Admitting: Emergency Medicine

## 2020-09-16 ENCOUNTER — Emergency Department (HOSPITAL_COMMUNITY)
Admission: EM | Admit: 2020-09-16 | Discharge: 2020-09-16 | Disposition: A | Discharge status: Home / Self Care | Payer: HRSA Program | Attending: Emergency Medicine | Admitting: Emergency Medicine

## 2020-09-16 ENCOUNTER — Emergency Department (HOSPITAL_COMMUNITY): Payer: HRSA Program

## 2020-09-16 DIAGNOSIS — J45909 Unspecified asthma, uncomplicated: Secondary | ICD-10-CM | POA: Diagnosis not present

## 2020-09-16 DIAGNOSIS — U071 COVID-19: Secondary | ICD-10-CM | POA: Insufficient documentation

## 2020-09-16 DIAGNOSIS — R509 Fever, unspecified: Secondary | ICD-10-CM | POA: Diagnosis present

## 2020-09-16 LAB — CBC
HCT: 44.9 % (ref 39.0–52.0)
Hemoglobin: 15.2 g/dL (ref 13.0–17.0)
MCH: 25.2 pg — ABNORMAL LOW (ref 26.0–34.0)
MCHC: 33.9 g/dL (ref 30.0–36.0)
MCV: 74.6 fL — ABNORMAL LOW (ref 80.0–100.0)
Platelets: 277 10*3/uL (ref 150–400)
RBC: 6.02 MIL/uL — ABNORMAL HIGH (ref 4.22–5.81)
RDW: 13.8 % (ref 11.5–15.5)
WBC: 8.4 10*3/uL (ref 4.0–10.5)
nRBC: 0 % (ref 0.0–0.2)

## 2020-09-16 LAB — BASIC METABOLIC PANEL
Anion gap: 9 (ref 5–15)
BUN: 13 mg/dL (ref 6–20)
CO2: 24 mmol/L (ref 22–32)
Calcium: 9.6 mg/dL (ref 8.9–10.3)
Chloride: 104 mmol/L (ref 98–111)
Creatinine, Ser: 1.19 mg/dL (ref 0.61–1.24)
GFR, Estimated: >60 mL/min (ref >60)
Glucose, Bld: 81 mg/dL (ref 70–99)
Potassium: 4.4 mmol/L (ref 3.5–5.1)
Sodium: 137 mmol/L (ref 135–145)

## 2020-09-16 LAB — RESP PANEL BY RT-PCR (FLU A&B, COVID) ARPGX2
Influenza A by PCR: NEGATIVE
Influenza B by PCR: NEGATIVE
SARS Coronavirus 2 by RT PCR: POSITIVE — AB

## 2020-09-16 NOTE — ED Provider Notes (Signed)
[facility] EMERGENCY DEPARTMENT Provider Note   CSN: [id number] Arrival date & time: 09/16/20  1220     History Chief Complaint  Patient presents with  . Fever  . Headache  . Shortness of Breath  . Chills    Jeffery Blackwell is a 40 y.o. male.  HPI 40 year old male with history of asthma presents to the ED with fatigue, fevers, chills, shortness of breath, headache approximately 6 days.  He is vaccinated for COVID.  He states at 1 point he was trying to eat soup and developed tunnel vision today.  He did not lose consciousness or hit his head.  He states he has been drinking plenty of fluids but eating poorly.  Denies any chest pain.  No known sick contacts.  No abdominal pain, nausea, vomiting, diarrhea. No hemoptysis.  Denies any vision changes, head injuries, not on blood thinners.  No dysuria, hematuria    Past Medical History:  Diagnosis Date  . Asthma     Patient Active Problem List   Diagnosis Date Noted  . Mild concussion 07/12/2018    Past Surgical History:  Procedure Laterality Date  . FOOT SURGERY         No family history on file.  Social History   Tobacco Use  . Smoking status: Never Smoker  . Smokeless tobacco: Never Used  Vaping Use  . Vaping Use: Never used  Substance Use Topics  . Alcohol use: Never  . Drug use: Never    Home Medications Prior to Admission medications   Medication Sig Start Date End Date Taking? Authorizing Provider  albuterol (VENTOLIN HFA) 108 (90 Base) MCG/ACT inhaler Inhale 2 puffs into the lungs every 4 (four) hours as needed for wheezing or shortness of breath. 03/20/20   Hall-Potvin, [place], PA-C  benzonatate (TESSALON) 100 MG capsule Take 1 capsule (100 mg total) by mouth every 8 (eight) hours. 03/20/20   Hall-Potvin, [place], PA-C  cetirizine (ZYRTEC ALLERGY) 10 MG tablet Take 1 tablet (10 mg total) by mouth daily. 03/20/20   Hall-Potvin, [place], PA-C  doxycycline (VIBRAMYCIN) 100 MG capsule Take  1 capsule (100 mg total) by mouth 2 (two) times daily. 04/06/20   Horton, Mayer Masker, MD  fluticasone (FLONASE) 50 MCG/ACT nasal spray Place 1 spray into both nostrils daily. 03/20/20   Hall-Potvin, [place], PA-C  predniSONE (DELTASONE) 50 MG tablet Take 1 tablet (50 mg total) by mouth daily with breakfast. 03/20/20   Hall-Potvin, [place], PA-C    Allergies    Ibuprofen  Review of Systems   Review of Systems  Constitutional: Positive for activity change, appetite change, chills, fatigue and fever.  Respiratory: Positive for cough and shortness of breath.   Cardiovascular: Negative for chest pain.  Gastrointestinal: Negative for abdominal pain, diarrhea, nausea and vomiting.  Genitourinary: Negative for dysuria.  Neurological: Positive for weakness.    Physical Exam Updated Vital Signs BP 126/83   Pulse 81   Temp 98.4 F (36.9 C) (Oral)   Resp 18   Ht 6' (1.829 m)   Wt 130 kg   SpO2 99%   BMI 38.87 kg/m   Physical Exam Vitals and nursing note reviewed.  Constitutional:      General: He is not in acute distress.    Appearance: He is well-developed and well-nourished. He is not ill-appearing or diaphoretic.  HENT:     Head: Normocephalic and atraumatic.  Eyes:     Conjunctiva/sclera: Conjunctivae normal.  Cardiovascular:     Rate and  Rhythm: Normal rate and regular rhythm.     Heart sounds: No murmur heard.   Pulmonary:     Effort: Pulmonary effort is normal. No respiratory distress.     Breath sounds: Normal breath sounds.  Abdominal:     Palpations: Abdomen is soft.     Tenderness: There is no abdominal tenderness.  Musculoskeletal:        General: No swelling, tenderness or edema.     Cervical back: Normal range of motion and neck supple. No rigidity.  Lymphadenopathy:     Cervical: No cervical adenopathy.  Skin:    General: Skin is warm and dry.  Neurological:     Mental Status: He is alert.  Psychiatric:        Mood and Affect: Mood and affect and mood  normal.        Behavior: Behavior normal.     ED Results / Procedures / Treatments   Labs (all labs ordered are listed, but only abnormal results are displayed) Labs Reviewed  RESP PANEL BY RT-PCR (FLU A&B, COVID) ARPGX2 - Abnormal; Notable for the following components:      Result Value   SARS Coronavirus 2 by RT PCR POSITIVE (*)    All other components within normal limits  CBC - Abnormal; Notable for the following components:   RBC 6.02 (*)    MCV 74.6 (*)    MCH 25.2 (*)    All other components within normal limits  BASIC METABOLIC PANEL    EKG EKG Interpretation  Date/Time:  Wednesday September 16 2020 13:30:05 EST Ventricular Rate:  94 PR Interval:  146 QRS Duration: 78 QT Interval:  342 QTC Calculation: 427 R Axis:   -7 Text Interpretation: Normal sinus rhythm Cannot rule out Anterior infarct , age undetermined Abnormal ECG since last tracing no significant change Confirmed by Mancel Bale [id number]) on 09/16/2020 2:14:49 PM   Radiology DG Chest Portable 1 View  Result Date: 09/16/2020 CLINICAL DATA:  Shortness of breath. EXAM: PORTABLE CHEST 1 VIEW COMPARISON:  01/24/2009 FINDINGS: 1340 hours. The lungs are clear without focal pneumonia, edema, pneumothorax or pleural effusion. The cardiopericardial silhouette is within normal limits for size. The visualized bony structures of the thorax show no acute abnormality. IMPRESSION: No acute findings. Electronically Signed   By: Kennith Center M.D.   On: 09/16/2020 14:09    Procedures Procedures (including critical care time)  Medications Ordered in ED Medications - No data to display  ED Course  I have reviewed the triage vital signs and the nursing notes.  Pertinent labs & imaging results that were available during my care of the patient were reviewed by me and considered in my medical decision making (see chart for details).    MDM Rules/Calculators/A&P                          40 year old male complaining of  generalized body aches, cough, mild shortness of breath, weakness.  On arrival, vitals are reassuring, afebrile, not tachycardic, tachypneic or hypoxic.  Physical exam benign, lung sounds clear, very well-appearing, ambulating around the room without difficulty.  Labs ordered in triage reviewed and interpreted by me, CBC without leukocytosis, BMP unremarkable.  COVID test is positive here.  X-rays no acute findings.  EKG normal sinus rhythm.  Low suspicion for PE, ACS, dissection.  Suspect this is all secondary to COVID-19.  Patient was educated on the status of his positive test, discussed supportive care.  Return precautions discussed.  He was understanding was agreeable.  At this stage of ED course, patient was medically screened and stable for discharge   This was a shared visit with my supervising physician Dr. Wilkie Aye who independently saw and evaluated the patient & provided guidance in evaluation/management/disposition ,in agreement with care   Final Clinical Impression(s) / ED Diagnoses Final diagnoses:  COVID-19    Rx / DC Orders ED Discharge Orders    None       Mare Ferrari, PA-C 09/16/20 1857    Rozelle Logan, DO 09/17/20 0002

## 2020-09-16 NOTE — Discharge Instructions (Signed)
Your COVID test was positive today,  please make sure to quarantine additional 7 to 10 days. You may take ibuprofen/Tylenol for body aches and fevers, drink plenty of fluids, over-the-counter cold and flu medications. You may also buy an over-the-counter pulse oximeter which can be found at your local pharmacy. If your oxygen saturation drops below 90%, please make sure to return to the ER. Return to the ER for any worsening shortness of breath.

## 2020-09-16 NOTE — ED Triage Notes (Signed)
Onset 6 days ago developed fatigue, fever, chills, shortness of breath, and headaches. Pain currently 6/10 throbbing. Last test for COVID 1 month ago.

## 2021-04-24 ENCOUNTER — Ambulatory Visit: Payer: Self-pay

## 2021-04-24 ENCOUNTER — Ambulatory Visit (INDEPENDENT_AMBULATORY_CARE_PROVIDER_SITE_OTHER): Payer: Self-pay

## 2021-04-24 ENCOUNTER — Ambulatory Visit
Admission: EM | Admit: 2021-04-24 | Discharge: 2021-04-24 | Disposition: A | Discharge status: Home / Self Care | Payer: Self-pay | Attending: Urgent Care | Admitting: Urgent Care

## 2021-04-24 DIAGNOSIS — M25532 Pain in left wrist: Secondary | ICD-10-CM

## 2021-04-24 DIAGNOSIS — M25531 Pain in right wrist: Secondary | ICD-10-CM

## 2021-04-24 MED ORDER — ACETAMINOPHEN 325 MG PO TABS
650.0000 mg | ORAL_TABLET | Freq: Once | ORAL | Status: AC
  Administered 2021-04-24: 650 mg via ORAL

## 2021-04-24 NOTE — Discharge Instructions (Addendum)
Do not use any nonsteroidal anti-inflammatories (NSAIDs) like ibuprofen, Motrin, naproxen, Aleve, etc. which are all available over-the-counter.  Please just use Tylenol at a dose of 500mg-650mg once every 6 hours as needed for your aches, pains, fevers. 

## 2021-04-24 NOTE — ED Triage Notes (Signed)
Two days ago, Pt was placed in handcuffs and sat in the front of a vehicle causing him to sit on his hands. Pt complains of bilateral wrist pain and numbness in his fingers. Pt works with his hands and wants to be sure that his hands are functioning properly due to the pain sxs lingering. Notes cracking and popping on bilateral wrist rotation. Has been taking tylenol with temporary relief.

## 2021-04-24 NOTE — ED Provider Notes (Signed)
[facility]   MRN: [id number] DOB: 07-13-1981  Subjective:   Jeffery Blackwell is a 40 y.o. male presenting for 2 day history of acute onset persistent bilateral wrist pain, popping. Was arrested 2 days ago, placed in hand cuffs and was placed in an awkward position, had a lot of his weight on his wrists. Has had popping and pain, slightly decreased range of motion. No ecchymosis. Has allergies to ibuprofen.    No current facility-administered medications for this encounter.  Current Outpatient Medications:    albuterol (VENTOLIN HFA) 108 (90 Base) MCG/ACT inhaler, Inhale 2 puffs into the lungs every 4 (four) hours as needed for wheezing or shortness of breath., Disp: 18 g, Rfl: 0   benzonatate (TESSALON) 100 MG capsule, Take 1 capsule (100 mg total) by mouth every 8 (eight) hours., Disp: 21 capsule, Rfl: 0   cetirizine (ZYRTEC ALLERGY) 10 MG tablet, Take 1 tablet (10 mg total) by mouth daily., Disp: 30 tablet, Rfl: 0   doxycycline (VIBRAMYCIN) 100 MG capsule, Take 1 capsule (100 mg total) by mouth 2 (two) times daily., Disp: 20 capsule, Rfl: 0   fluticasone (FLONASE) 50 MCG/ACT nasal spray, Place 1 spray into both nostrils daily., Disp: 16 g, Rfl: 0   predniSONE (DELTASONE) 50 MG tablet, Take 1 tablet (50 mg total) by mouth daily with breakfast., Disp: 5 tablet, Rfl: 0   Allergies  Allergen Reactions   Ibuprofen Swelling    Past Medical History:  Diagnosis Date   Asthma      Past Surgical History:  Procedure Laterality Date   FOOT SURGERY      History reviewed. No pertinent family history.  Social History   Tobacco Use   Smoking status: Never   Smokeless tobacco: Never  Vaping Use   Vaping Use: Never used  Substance Use Topics   Alcohol use: Never   Drug use: Never    ROS   Objective:   Vitals: BP 123/78 (BP Location: Left Arm)   Pulse 89   Temp 98.4 F (36.9 C) (Oral)   Resp 18   SpO2 95%   Physical Exam Constitutional:      General: He is  not in acute distress.    Appearance: Normal appearance. He is well-developed and normal weight. He is not ill-appearing, toxic-appearing or diaphoretic.  HENT:     Head: Normocephalic and atraumatic.     Right Ear: External ear normal.     Left Ear: External ear normal.     Nose: Nose normal.     Mouth/Throat:     Pharynx: Oropharynx is clear.  Eyes:     General: No scleral icterus.       Right eye: No discharge.        Left eye: No discharge.     Extraocular Movements: Extraocular movements intact.     Conjunctiva/sclera: Conjunctivae normal.     Pupils: Pupils are equal, round, and reactive to light.  Cardiovascular:     Rate and Rhythm: Normal rate.  Pulmonary:     Effort: Pulmonary effort is normal.  Musculoskeletal:     Right wrist: Tenderness and bony tenderness present. No swelling, deformity, effusion, lacerations, snuff box tenderness or crepitus. Normal range of motion. Normal pulse.     Left wrist: Tenderness and bony tenderness present. No swelling, deformity, effusion, lacerations, snuff box tenderness or crepitus. Normal range of motion. Normal pulse.     Cervical back: Normal range of motion.  Skin:    General: Skin  is warm and dry.  Neurological:     Mental Status: He is alert and oriented to person, place, and time.  Psychiatric:        Mood and Affect: Mood normal.        Behavior: Behavior normal.        Thought Content: Thought content normal.        Judgment: Judgment normal.   DG Wrist Complete Left  Result Date: 04/24/2021 CLINICAL DATA:  Bilateral wrist pain EXAM: LEFT WRIST - COMPLETE 3+ VIEW COMPARISON:  None. FINDINGS: There is no evidence of fracture or dislocation. There is no evidence of arthropathy or other focal bone abnormality. Soft tissues are unremarkable. IMPRESSION: Negative left wrist radiographs. Electronically Signed   By: Caprice Renshaw M.D.   On: 04/24/2021 12:10   DG Wrist Complete Right  Result Date: 04/24/2021 CLINICAL DATA:  Wrist  pain EXAM: RIGHT WRIST - COMPLETE 3+ VIEW COMPARISON:  None. FINDINGS: There is no evidence of fracture or dislocation. There is no evidence of arthropathy or other focal bone abnormality. Soft tissues are unremarkable. IMPRESSION: Negative right wrist radiographs. Electronically Signed   By: Caprice Renshaw M.D.   On: 04/24/2021 12:11    Assessment and Plan :   PDMP not reviewed this encounter.  1. Bilateral wrist pain     Will manage conservatively for pain related to pain held in a prolonged awkward position.  Patient cannot tolerate NSAIDs due to his history of allergies and therefore we will have him use Tylenol only. Counseled patient on potential for adverse effects with medications prescribed/recommended today, ER and return-to-clinic precautions discussed, patient verbalized understanding.    Wallis Bamberg, [place] 04/24/21 1217

## 2021-08-30 ENCOUNTER — Encounter: Payer: Self-pay | Admitting: Emergency Medicine

## 2021-08-30 ENCOUNTER — Ambulatory Visit
Admission: EM | Admit: 2021-08-30 | Discharge: 2021-08-30 | Disposition: A | Discharge status: Home / Self Care | Payer: Self-pay | Attending: Internal Medicine | Admitting: Internal Medicine

## 2021-08-30 ENCOUNTER — Other Ambulatory Visit: Payer: Self-pay

## 2021-08-30 DIAGNOSIS — R197 Diarrhea, unspecified: Secondary | ICD-10-CM

## 2021-08-30 DIAGNOSIS — Z20822 Contact with and (suspected) exposure to covid-19: Secondary | ICD-10-CM

## 2021-08-30 DIAGNOSIS — R112 Nausea with vomiting, unspecified: Secondary | ICD-10-CM

## 2021-08-30 MED ORDER — ONDANSETRON 4 MG PO TBDP: 4.0000 mg | ORAL_TABLET | Freq: Three times a day (TID) | ORAL | 0 refills | Status: DC | PRN

## 2021-08-30 NOTE — ED Provider Notes (Signed)
[name]    CSN: [id number] Arrival date & time: 08/30/21  1445      History   Chief Complaint Chief Complaint  Patient presents with   Diarrhea    HPI Jeffery Blackwell is a 40 y.o. male.   Patient presents with nausea, vomiting, diarrhea, abdominal pain.  Abdominal pain is generalized and intermittent.  Patient reports abdominal pain occurs when he has to have a bowel movement only.  Had 4 episodes of diarrhea and 2 episodes of vomiting.  Symptoms started yesterday.  Denies any blood in stool or emesis.  Denies any fevers or known sick contacts.  Denies any upper respiratory symptoms. denies chest pain or shortness of breath.   Diarrhea  Past Medical History:  Diagnosis Date   Asthma     Patient Active Problem List   Diagnosis Date Noted   Mild concussion 07/12/2018    Past Surgical History:  Procedure Laterality Date   FOOT SURGERY         Home Medications    Prior to Admission medications   Medication Sig Start Date End Date Taking? Authorizing Provider  ondansetron (ZOFRAN-ODT) 4 MG disintegrating tablet Take 1 tablet (4 mg total) by mouth every 8 (eight) hours as needed for nausea or vomiting. 08/30/21  Yes Jaimie Redditt, Acie Fredrickson, FNP  albuterol (VENTOLIN HFA) 108 (90 Base) MCG/ACT inhaler Inhale 2 puffs into the lungs every 4 (four) hours as needed for wheezing or shortness of breath. Patient not taking: Reported on 08/30/2021 03/20/20   Hall-Potvin, [place], PA-C  cetirizine (ZYRTEC ALLERGY) 10 MG tablet Take 1 tablet (10 mg total) by mouth daily. Patient not taking: Reported on 08/30/2021 03/20/20   Hall-Potvin, [place], PA-C    Family History Family History  Problem Relation Age of Onset   Healthy Father     Social History Social History   Tobacco Use   Smoking status: Never   Smokeless tobacco: Never  Vaping Use   Vaping Use: Never used  Substance Use Topics   Alcohol use: Yes   Drug use: Never     Allergies    Ibuprofen   Review of Systems Review of Systems Per HPI  Physical Exam Triage Vital Signs ED Triage Vitals  Enc Vitals Group     BP 08/30/21 1634 132/83     Pulse Rate 08/30/21 1634 91     Resp 08/30/21 1634 20     Temp 08/30/21 1647 98.6 F (37 C)     Temp Source 08/30/21 1634 Oral     SpO2 08/30/21 1634 94 %     Weight --      Height --      Head Circumference --      Peak Flow --      Pain Score 08/30/21 1631 10     Pain Loc --      Pain Edu? --      Excl. in GC? --    No data found.  Updated Vital Signs BP 132/83 (BP Location: Left Arm)    Pulse 91    Temp 98.6 F (37 C) (Oral)    Resp 20    SpO2 94%   Visual Acuity Right Eye Distance:   Left Eye Distance:   Bilateral Distance:    Right Eye Near:   Left Eye Near:    Bilateral Near:     Physical Exam Constitutional:      General: He is not in acute distress.    Appearance: Normal  appearance. He is not toxic-appearing or diaphoretic.  HENT:     Head: Normocephalic and atraumatic.  Eyes:     Extraocular Movements: Extraocular movements intact.     Conjunctiva/sclera: Conjunctivae normal.  Cardiovascular:     Rate and Rhythm: Normal rate and regular rhythm.     Pulses: Normal pulses.     Heart sounds: Normal heart sounds.  Pulmonary:     Effort: Pulmonary effort is normal. No respiratory distress.     Breath sounds: Normal breath sounds.  Abdominal:     General: Bowel sounds are normal. There is no distension.     Palpations: Abdomen is soft.     Tenderness: There is no abdominal tenderness.  Neurological:     General: No focal deficit present.     Mental Status: He is alert and oriented to person, place, and time. Mental status is at baseline.  Psychiatric:        Mood and Affect: Mood normal.        Behavior: Behavior normal.        Thought Content: Thought content normal.        Judgment: Judgment normal.     UC Treatments / Results  Labs (all labs ordered are listed, but only abnormal  results are displayed) Labs Reviewed  COVID-19, FLU A+B NAA    EKG   Radiology No results found.  Procedures Procedures (including critical care time)  Medications Ordered in UC Medications - No data to display  Initial Impression / Assessment and Plan / UC Course  I have reviewed the triage vital signs and the nursing notes.  Pertinent labs & imaging results that were available during my care of the patient were reviewed by me and considered in my medical decision making (see chart for details).     Patient's symptoms appear viral in etiology.  Differential diagnoses includes viral gastroenteritis.  Will treat with ondansetron as needed for nausea.  Patient to increase clear oral fluid intake to prevent dehydration.  No current signs of dehydration on physical exam.  Discussed return precautions.  Patient verbalized understanding and was agreeable with plan. Final Clinical Impressions(s) / UC Diagnoses   Final diagnoses:  Nausea vomiting and diarrhea  Encounter for laboratory testing for COVID-19 virus     Discharge Instructions      It appears that you may have a stomach virus.  You were prescribed nausea medication to take as needed.  Please increase clear oral fluid hydration to prevent dehydration.  COVID-19 and flu test is pending.    ED Prescriptions     Medication Sig Dispense Auth. Provider   ondansetron (ZOFRAN-ODT) 4 MG disintegrating tablet Take 1 tablet (4 mg total) by mouth every 8 (eight) hours as needed for nausea or vomiting. 20 tablet [place], Acie Fredrickson, [place]      PDMP not reviewed this encounter.   Gustavus Bryant, [place] 08/30/21 1705

## 2021-08-30 NOTE — Discharge Instructions (Signed)
It appears that you may have a stomach virus.  You were prescribed nausea medication to take as needed.  Please increase clear oral fluid hydration to prevent dehydration.  COVID-19 and flu test is pending.

## 2021-08-30 NOTE — ED Triage Notes (Signed)
Reports having green diarrhea, abdominal pain.  Patient has vomited.  Reports 2 episodes of dry heaving, and 4 episodes of diarrhea.  Symptoms started today

## 2021-08-30 NOTE — ED Notes (Signed)
Have called patient, patient has not arrived in lobby

## 2021-08-31 LAB — COVID-19, FLU A+B NAA
Influenza A, NAA: NOT DETECTED
Influenza B, NAA: NOT DETECTED
SARS-CoV-2, NAA: NOT DETECTED

## 2021-09-02 ENCOUNTER — Ambulatory Visit
Admission: EM | Admit: 2021-09-02 | Discharge: 2021-09-02 | Disposition: A | Discharge status: Home / Self Care | Payer: Self-pay | Attending: Physician Assistant | Admitting: Physician Assistant

## 2021-09-02 ENCOUNTER — Encounter: Payer: Self-pay | Admitting: Emergency Medicine

## 2021-09-02 ENCOUNTER — Other Ambulatory Visit: Payer: Self-pay

## 2021-09-02 DIAGNOSIS — R112 Nausea with vomiting, unspecified: Secondary | ICD-10-CM

## 2021-09-02 DIAGNOSIS — J45901 Unspecified asthma with (acute) exacerbation: Secondary | ICD-10-CM

## 2021-09-02 MED ORDER — ONDANSETRON 4 MG PO TBDP: 4.0000 mg | ORAL_TABLET | Freq: Three times a day (TID) | ORAL | 0 refills | Status: DC | PRN

## 2021-09-02 MED ORDER — PREDNISONE 20 MG PO TABS: 40.0000 mg | ORAL_TABLET | Freq: Every day | ORAL | 0 refills | Status: AC

## 2021-09-02 MED ORDER — ALBUTEROL SULFATE HFA 108 (90 BASE) MCG/ACT IN AERS: 1.0000 | INHALATION_SPRAY | Freq: Four times a day (QID) | RESPIRATORY_TRACT | 0 refills | Status: DC | PRN

## 2021-09-02 NOTE — ED Provider Notes (Signed)
[name]    CSN: [id number] Arrival date & time: 09/02/21  9767      History   Chief Complaint Chief Complaint  Patient presents with   Emesis    HPI Jeffery Blackwell is a 40 y.o. male.   Patient here today for evaluation of continued nausea, vomiting and diarrhea he has had for 7 days. He reports that he has been able to hold down liquids but less so solids. He reports cough and shortness of breath as well-- feels he may be having an asthma flare. He has not had fever. He was screening for covid and flu which were both negative.   The history is provided by the patient.   Past Medical History:  Diagnosis Date   Asthma     Patient Active Problem List   Diagnosis Date Noted   Mild concussion 07/12/2018    Past Surgical History:  Procedure Laterality Date   FOOT SURGERY         Home Medications    Prior to Admission medications   Medication Sig Start Date End Date Taking? Authorizing Provider  albuterol (VENTOLIN HFA) 108 (90 Base) MCG/ACT inhaler Inhale 1-2 puffs into the lungs every 6 (six) hours as needed for wheezing or shortness of breath. 09/02/21  Yes Tomi Bamberger, PA-C  predniSONE (DELTASONE) 20 MG tablet Take 2 tablets (40 mg total) by mouth daily with breakfast for 5 days. 09/02/21 09/07/21 Yes Tomi Bamberger, PA-C  cetirizine (ZYRTEC ALLERGY) 10 MG tablet Take 1 tablet (10 mg total) by mouth daily. Patient not taking: Reported on 08/30/2021 03/20/20   Hall-Potvin, [place], PA-C  ondansetron (ZOFRAN-ODT) 4 MG disintegrating tablet Take 1 tablet (4 mg total) by mouth every 8 (eight) hours as needed for nausea or vomiting. 09/02/21   Tomi Bamberger, PA-C    Family History Family History  Problem Relation Age of Onset   Healthy Father     Social History Social History   Tobacco Use   Smoking status: Never   Smokeless tobacco: Never  Vaping Use   Vaping Use: Never used  Substance Use Topics   Alcohol use: Yes   Drug use:  Never     Allergies   Ibuprofen   Review of Systems Review of Systems  Constitutional:  Negative for chills and fever.  HENT:  Positive for congestion. Negative for ear pain and sore throat.   Eyes:  Negative for discharge and redness.  Respiratory:  Positive for cough and shortness of breath.   Gastrointestinal:  Positive for diarrhea, nausea and vomiting. Negative for abdominal pain.    Physical Exam Triage Vital Signs ED Triage Vitals  Enc Vitals Group     BP      Pulse      Resp      Temp      Temp src      SpO2      Weight      Height      Head Circumference      Peak Flow      Pain Score      Pain Loc      Pain Edu?      Excl. in GC?    No data found.  Updated Vital Signs BP 124/81 (BP Location: Left Arm)    Pulse 86    Temp 98.5 F (36.9 C) (Oral)    Resp 20    Ht 6' (1.829 m)    Wt 286 lb  9.6 oz (130 kg)    SpO2 96%    BMI 38.87 kg/m     Physical Exam Vitals and nursing note reviewed.  Constitutional:      General: He is not in acute distress.    Appearance: Normal appearance. He is not ill-appearing.  HENT:     Head: Normocephalic and atraumatic.     Nose: Congestion and rhinorrhea present.     Mouth/Throat:     Mouth: Mucous membranes are moist.  Eyes:     Conjunctiva/sclera: Conjunctivae normal.  Cardiovascular:     Rate and Rhythm: Normal rate and regular rhythm.     Heart sounds: Normal heart sounds. No murmur heard. Pulmonary:     Effort: Pulmonary effort is normal. No respiratory distress.     Breath sounds: Normal breath sounds. No wheezing, rhonchi or rales.  Skin:    General: Skin is warm and dry.  Neurological:     Mental Status: He is alert.  Psychiatric:        Mood and Affect: Mood normal.        Thought Content: Thought content normal.     UC Treatments / Results  Labs (all labs ordered are listed, but only abnormal results are displayed) Labs Reviewed  CBC WITH DIFFERENTIAL/PLATELET  BASIC METABOLIC PANEL     EKG   Radiology No results found.  Procedures Procedures (including critical care time)  Medications Ordered in UC Medications - No data to display  Initial Impression / Assessment and Plan / UC Course  I have reviewed the triage vital signs and the nursing notes.  Pertinent labs & imaging results that were available during my care of the patient were reviewed by me and considered in my medical decision making (see chart for details).    Prednisone and albuterol prescribed to treat possible asthma exacerbation. Patient did not pick  up zofran due to cost- advised to use [facility] for discounted prices. Will order labs given duration of symptoms. Recommended follow up in ED with any worsening symptoms.  Final Clinical Impressions(s) / UC Diagnoses   Final diagnoses:  Asthma with acute exacerbation, unspecified asthma severity, unspecified whether persistent  Nausea and vomiting, unspecified vomiting type     Discharge Instructions       Download [facility] app on your phone for discounted prices on medications.      ED Prescriptions     Medication Sig Dispense Auth. Provider   predniSONE (DELTASONE) 20 MG tablet Take 2 tablets (40 mg total) by mouth daily with breakfast for 5 days. 10 tablet Erma Pinto F, PA-C   albuterol (VENTOLIN HFA) 108 (90 Base) MCG/ACT inhaler Inhale 1-2 puffs into the lungs every 6 (six) hours as needed for wheezing or shortness of breath. 6.7 g Erma Pinto F, PA-C   ondansetron (ZOFRAN-ODT) 4 MG disintegrating tablet Take 1 tablet (4 mg total) by mouth every 8 (eight) hours as needed for nausea or vomiting. 20 tablet Tomi Bamberger, PA-C      PDMP not reviewed this encounter.   Tomi Bamberger, PA-C 09/02/21 1054

## 2021-09-02 NOTE — ED Triage Notes (Signed)
Patient c/o vomiting and diarrhea x 7 days, seen on Sunday sx's no better.  Patient's COVID/FLU were both negative.  Patient states his entire body hurts, SOB, congestion.

## 2021-09-02 NOTE — Discharge Instructions (Signed)
°  Download [facility] app on your phone for discounted prices on medications.

## 2021-09-03 LAB — BASIC METABOLIC PANEL
BUN/Creatinine Ratio: 8 — ABNORMAL LOW (ref 9–20)
BUN: 8 mg/dL (ref 6–24)
CO2: 22 mmol/L (ref 20–29)
Calcium: 9.7 mg/dL (ref 8.7–10.2)
Chloride: 103 mmol/L (ref 96–106)
Creatinine, Ser: 0.98 mg/dL (ref 0.76–1.27)
Glucose: 105 mg/dL — ABNORMAL HIGH (ref 70–99)
Potassium: 4.6 mmol/L (ref 3.5–5.2)
Sodium: 137 mmol/L (ref 134–144)
eGFR: 100 mL/min/{1.73_m2} (ref >59)

## 2021-09-03 LAB — CBC WITH DIFFERENTIAL/PLATELET
Basophils Absolute: 0 10*3/uL (ref 0.0–0.2)
Basos: 0 %
EOS (ABSOLUTE): 0.2 10*3/uL (ref 0.0–0.4)
Eos: 3 %
Hematocrit: 45.1 % (ref 37.5–51.0)
Hemoglobin: 15.2 g/dL (ref 13.0–17.7)
Immature Grans (Abs): 0 10*3/uL (ref 0.0–0.1)
Immature Granulocytes: 0 %
Lymphocytes Absolute: 1.1 10*3/uL (ref 0.7–3.1)
Lymphs: 13 %
MCH: 25.9 pg — ABNORMAL LOW (ref 26.6–33.0)
MCHC: 33.7 g/dL (ref 31.5–35.7)
MCV: 77 fL — ABNORMAL LOW (ref 79–97)
Monocytes Absolute: 0.6 10*3/uL (ref 0.1–0.9)
Monocytes: 7 %
Neutrophils Absolute: 6.6 10*3/uL (ref 1.4–7.0)
Neutrophils: 77 %
Platelets: 320 10*3/uL (ref 150–450)
RBC: 5.87 x10E6/uL — ABNORMAL HIGH (ref 4.14–5.80)
RDW: 15.1 % (ref 11.6–15.4)
WBC: 8.6 10*3/uL (ref 3.4–10.8)

## 2021-11-14 ENCOUNTER — Ambulatory Visit
Admission: EM | Admit: 2021-11-14 | Discharge: 2021-11-14 | Disposition: A | Discharge status: Home / Self Care | Payer: Self-pay | Attending: Internal Medicine | Admitting: Internal Medicine

## 2021-11-14 ENCOUNTER — Encounter: Payer: Self-pay | Admitting: Emergency Medicine

## 2021-11-14 ENCOUNTER — Other Ambulatory Visit: Payer: Self-pay

## 2021-11-14 DIAGNOSIS — R369 Urethral discharge, unspecified: Secondary | ICD-10-CM | POA: Insufficient documentation

## 2021-11-14 DIAGNOSIS — Z113 Encounter for screening for infections with a predominantly sexual mode of transmission: Secondary | ICD-10-CM | POA: Insufficient documentation

## 2021-11-14 NOTE — Discharge Instructions (Signed)
Your STD swab is pending.  We will call if it is positive and treat as appropriate.  Please refrain from sexual activity until test results and treatment are complete. 

## 2021-11-14 NOTE — ED Provider Notes (Signed)
?[facility] ? ? ? ?CSN: [id number] ?Arrival date & time: 11/14/21  [id number] ? ? ?  ? ?History   ?Chief Complaint ?Chief Complaint  ?Patient presents with  ? Exposure to STD  ? ? ?HPI ?Jeffery Blackwell is a 41 y.o. male.  ? ?Patient presents today due to brown penile discharge that started yesterday.  Denies dysuria, urinary frequency, testicular pain, fever, pelvic pain, back pain.  Denies any known exposure to STD but has had unprotected sexual intercourse with multiple sexual partners recently.  Patient does not want HIV or syphilis testing. ? ? ?Exposure to STD ? ? ?Past Medical History:  ?Diagnosis Date  ? Asthma   ? ? ?Patient Active Problem List  ? Diagnosis Date Noted  ? Mild concussion 07/12/2018  ? ? ?Past Surgical History:  ?Procedure Laterality Date  ? FOOT SURGERY    ? ? ? ? ? ?Home Medications   ? ?Prior to Admission medications   ?Medication Sig Start Date End Date Taking? Authorizing Provider  ?albuterol (VENTOLIN HFA) 108 (90 Base) MCG/ACT inhaler Inhale 1-2 puffs into the lungs every 6 (six) hours as needed for wheezing or shortness of breath. 09/02/21   Francene Finders, PA-C  ?cetirizine (ZYRTEC ALLERGY) 10 MG tablet Take 1 tablet (10 mg total) by mouth daily. ?Patient not taking: Reported on 08/30/2021 03/20/20   Hall-Potvin, [place], PA-C  ?ondansetron (ZOFRAN-ODT) 4 MG disintegrating tablet Take 1 tablet (4 mg total) by mouth every 8 (eight) hours as needed for nausea or vomiting. 09/02/21   Francene Finders, PA-C  ? ? ?Family History ?Family History  ?Problem Relation Age of Onset  ? Healthy Father   ? ? ?Social History ?Social History  ? ?Tobacco Use  ? Smoking status: Never  ? Smokeless tobacco: Never  ?Vaping Use  ? Vaping Use: Never used  ?Substance Use Topics  ? Alcohol use: Yes  ? Drug use: Never  ? ? ? ?Allergies   ?Ibuprofen ? ? ?Review of Systems ?Review of Systems ?Per HPI ? ?Physical Exam ?Triage Vital Signs ?ED Triage Vitals [11/14/21 0844]  ?Enc Vitals Group  ?   BP 133/80   ?   Pulse Rate 83  ?   Resp 18  ?   Temp 98.1 ?F (36.7 ?C)  ?   Temp Source Oral  ?   SpO2 95 %  ?   Weight 299 lb 4 oz (135.7 kg)  ?   Height 6' (1.829 m)  ?   Head Circumference   ?   Peak Flow   ?   Pain Score 0  ?   Pain Loc   ?   Pain Edu?   ?   Excl. in [place]?   ? ?No data found. ? ?Updated Vital Signs ?BP 133/80 (BP Location: Left Arm)   Pulse 83   Temp 98.1 ?F (36.7 ?C) (Oral)   Resp 18   Ht 6' (1.829 m)   Wt 299 lb 4 oz (135.7 kg)   SpO2 95%   BMI 40.59 kg/m?  ? ?Visual Acuity ?Right Eye Distance:   ?Left Eye Distance:   ?Bilateral Distance:   ? ?Right Eye Near:   ?Left Eye Near:    ?Bilateral Near:    ? ?Physical Exam ?Constitutional:   ?   General: He is not in acute distress. ?   Appearance: Normal appearance. He is not toxic-appearing or diaphoretic.  ?HENT:  ?   Head: Normocephalic and atraumatic.  ?Eyes:  ?  Extraocular Movements: Extraocular movements intact.  ?   Conjunctiva/sclera: Conjunctivae normal.  ?Pulmonary:  ?   Effort: Pulmonary effort is normal.  ?Genitourinary: ?   Comments: Deferred with shared decision making. Self swab performed.  ?Neurological:  ?   General: No focal deficit present.  ?   Mental Status: He is alert and oriented to person, place, and time. Mental status is at baseline.  ?Psychiatric:     ?   Mood and Affect: Mood normal.     ?   Behavior: Behavior normal.     ?   Thought Content: Thought content normal.     ?   Judgment: Judgment normal.  ? ? ? ?UC Treatments / Results  ?Labs ?(all labs ordered are listed, but only abnormal results are displayed) ?Labs Reviewed  ?CYTOLOGY, (ORAL, ANAL, URETHRAL) ANCILLARY ONLY  ? ? ?EKG ? ? ?Radiology ?No results found. ? ?Procedures ?Procedures (including critical care time) ? ?Medications Ordered in UC ?Medications - No data to display ? ?Initial Impression / Assessment and Plan / UC Course  ?I have reviewed the triage vital signs and the nursing notes. ? ?Pertinent labs & imaging results that were available during my care of  the patient were reviewed by me and considered in my medical decision making (see chart for details). ? ?  ? ?Cytology swab pending to test for STDs.  Will await results for treatment.  Patient to refrain from sexual activity until test results and treatment are complete.  Patient verbalized understanding and was agreeable with plan. ?Final Clinical Impressions(s) / UC Diagnoses  ? ?Final diagnoses:  ?Penile discharge  ?Screening examination for venereal disease  ? ? ? ?Discharge Instructions   ? ?  ?Your STD swab is pending.  We will call if it is positive and treat as appropriate.  Please refrain from sexual activity until test results and treatment are complete. ? ? ? ?ED Prescriptions   ?None ?  ? ?PDMP not reviewed this encounter. ?  ?Teodora Medici, [place] ?11/14/21 [id number] ? ?

## 2021-11-14 NOTE — ED Triage Notes (Signed)
Patient states that he's been with a few people and now having penile discharge x 2 days.  The discharge is brownish in color.  Requesting STI check.  No dysuria. ?

## 2021-11-15 LAB — CYTOLOGY, (ORAL, ANAL, URETHRAL) ANCILLARY ONLY
Chlamydia: NEGATIVE
Comment: NEGATIVE
Comment: NEGATIVE
Comment: NORMAL
Neisseria Gonorrhea: NEGATIVE
Trichomonas: NEGATIVE

## 2021-11-17 ENCOUNTER — Ambulatory Visit
Admission: EM | Admit: 2021-11-17 | Discharge: 2021-11-17 | Disposition: A | Discharge status: Home / Self Care | Payer: Self-pay | Attending: Internal Medicine | Admitting: Internal Medicine

## 2021-11-17 ENCOUNTER — Other Ambulatory Visit: Payer: Self-pay

## 2021-11-17 DIAGNOSIS — Z113 Encounter for screening for infections with a predominantly sexual mode of transmission: Secondary | ICD-10-CM | POA: Insufficient documentation

## 2021-11-17 DIAGNOSIS — R369 Urethral discharge, unspecified: Secondary | ICD-10-CM | POA: Insufficient documentation

## 2021-11-17 MED ORDER — DOXYCYCLINE HYCLATE 100 MG PO CAPS: 100.0000 mg | ORAL_CAPSULE | Freq: Two times a day (BID) | ORAL | 0 refills | Status: DC

## 2021-11-17 MED ORDER — CEFTRIAXONE SODIUM 1 G IJ SOLR
0.5000 g | Freq: Once | INTRAMUSCULAR | Status: AC
  Administered 2021-11-17: 0.5 g via INTRAMUSCULAR

## 2021-11-17 NOTE — ED Triage Notes (Signed)
Pt c/o penile dc. States was here at clinic and tested (-) for STIs but cont to have discharge. Now c/o "pulling pain" ?

## 2021-11-17 NOTE — Discharge Instructions (Addendum)
Your penile swab is pending. You have been given two antibiotics today. Follow up with urology if symptoms persist or worsen.  ?

## 2021-11-17 NOTE — ED Provider Notes (Signed)
?[name] ? ? ? ?CSN: [id number] ?Arrival date & time: 11/17/21  1907 ? ? ?  ? ?History   ?Chief Complaint ?Chief Complaint  ?Patient presents with  ? Penile Discharge  ? ? ?HPI ?Jeffery Blackwell is a 41 y.o. male.  ? ?Patient presents with penile discharge that has been present for multiple days.  Patient was last seen on 11/14/2021 and had negative cytology swab.  He reports that symptoms have been persistent and now he has a "pulling sensation in his penis".  Denies fevers, body aches, testicular pain, dysuria, urinary frequency.  Patient continues to deny any confirmed exposure to STD but has had multiple sexual partners. ? ? ?Penile Discharge ? ? ?Past Medical History:  ?Diagnosis Date  ? Asthma   ? ? ?Patient Active Problem List  ? Diagnosis Date Noted  ? Mild concussion 07/12/2018  ? ? ?Past Surgical History:  ?Procedure Laterality Date  ? FOOT SURGERY    ? ? ? ? ? ?Home Medications   ? ?Prior to Admission medications   ?Medication Sig Start Date End Date Taking? Authorizing Provider  ?doxycycline (VIBRAMYCIN) 100 MG capsule Take 1 capsule (100 mg total) by mouth 2 (two) times daily. 11/17/21  Yes Shiasia Porro, Acie Fredrickson, FNP  ?albuterol (VENTOLIN HFA) 108 (90 Base) MCG/ACT inhaler Inhale 1-2 puffs into the lungs every 6 (six) hours as needed for wheezing or shortness of breath. 09/02/21   Tomi Bamberger, PA-C  ?cetirizine (ZYRTEC ALLERGY) 10 MG tablet Take 1 tablet (10 mg total) by mouth daily. ?Patient not taking: Reported on 08/30/2021 03/20/20   Hall-Potvin, [place], PA-C  ?ondansetron (ZOFRAN-ODT) 4 MG disintegrating tablet Take 1 tablet (4 mg total) by mouth every 8 (eight) hours as needed for nausea or vomiting. 09/02/21   Tomi Bamberger, PA-C  ? ? ?Family History ?Family History  ?Problem Relation Age of Onset  ? Healthy Father   ? ? ?Social History ?Social History  ? ?Tobacco Use  ? Smoking status: Never  ? Smokeless tobacco: Never  ?Vaping Use  ? Vaping Use: Never used  ?Substance Use Topics  ?  Alcohol use: Yes  ? Drug use: Never  ? ? ? ?Allergies   ?Ibuprofen ? ? ?Review of Systems ?Review of Systems ?Per HPI ? ?Physical Exam ?Triage Vital Signs ?ED Triage Vitals  ?Enc Vitals Group  ?   BP 11/17/21 1934 129/86  ?   Pulse Rate 11/17/21 1934 82  ?   Resp 11/17/21 1934 18  ?   Temp 11/17/21 1934 98.3 ?F (36.8 ?C)  ?   Temp Source 11/17/21 1934 Oral  ?   SpO2 11/17/21 1934 98 %  ?   Weight --   ?   Height --   ?   Head Circumference --   ?   Peak Flow --   ?   Pain Score 11/17/21 1935 0  ?   Pain Loc --   ?   Pain Edu? --   ?   Excl. in GC? --   ? ?No data found. ? ?Updated Vital Signs ?BP 129/86 (BP Location: Left Arm)   Pulse 82   Temp 98.3 ?F (36.8 ?C) (Oral)   Resp 18   SpO2 98%  ? ?Visual Acuity ?Right Eye Distance:   ?Left Eye Distance:   ?Bilateral Distance:   ? ?Right Eye Near:   ?Left Eye Near:    ?Bilateral Near:    ? ?Physical Exam ?Exam conducted with a chaperone  present.  ?Constitutional:   ?   General: He is not in acute distress. ?   Appearance: Normal appearance. He is not toxic-appearing or diaphoretic.  ?HENT:  ?   Head: Normocephalic and atraumatic.  ?Eyes:  ?   Extraocular Movements: Extraocular movements intact.  ?   Conjunctiva/sclera: Conjunctivae normal.  ?Pulmonary:  ?   Effort: Pulmonary effort is normal.  ?Genitourinary: ?   Penis: Normal.   ?   Testes: Normal. Cremasteric reflex is present.  ?   Comments: Greenish discharge present at distal end of penis. ?Neurological:  ?   General: No focal deficit present.  ?   Mental Status: He is alert and oriented to person, place, and time. Mental status is at baseline.  ?Psychiatric:     ?   Mood and Affect: Mood normal.     ?   Behavior: Behavior normal.     ?   Thought Content: Thought content normal.     ?   Judgment: Judgment normal.  ? ? ? ?UC Treatments / Results  ?Labs ?(all labs ordered are listed, but only abnormal results are displayed) ?Labs Reviewed  ?CYTOLOGY, (ORAL, ANAL, URETHRAL) ANCILLARY ONLY   ? ? ?EKG ? ? ?Radiology ?No results found. ? ?Procedures ?Procedures (including critical care time) ? ?Medications Ordered in UC ?Medications  ?cefTRIAXone (ROCEPHIN) injection 0.5 g (0.5 g Intramuscular Given 11/17/21 1955)  ? ? ?Initial Impression / Assessment and Plan / UC Course  ?I have reviewed the triage vital signs and the nursing notes. ? ?Pertinent labs & imaging results that were available during my care of the patient were reviewed by me and considered in my medical decision making (see chart for details). ? ?  ? ?Will cover for gonorrhea and chlamydia with Rocephin IM in urgent care today as well as doxycycline sent to pharmacy.  Cytology swab repeated by me.  Awaiting results.  Will change or add treatment if needed once results are complete.  Patient may need to follow-up with urologist if negative results and symptoms persist so he was provided with contact information.  Discussed return precautions.  Patient verbalized understanding and was agreeable with plan. ?Final Clinical Impressions(s) / UC Diagnoses  ? ?Final diagnoses:  ?Penile discharge  ?Screening examination for venereal disease  ? ? ? ?Discharge Instructions   ? ?  ?Your penile swab is pending. You have been given two antibiotics today. Follow up with urology if symptoms persist or worsen.  ? ? ? ?ED Prescriptions   ? ? Medication Sig Dispense Auth. Provider  ? doxycycline (VIBRAMYCIN) 100 MG capsule Take 1 capsule (100 mg total) by mouth 2 (two) times daily. 20 capsule Ervin Knack E, [place]  ? ?  ? ?PDMP not reviewed this encounter. ?  ?Gustavus Bryant, [place] ?11/17/21 2009 ? ?

## 2021-11-19 LAB — CYTOLOGY, (ORAL, ANAL, URETHRAL) ANCILLARY ONLY
Chlamydia: NEGATIVE
Comment: NEGATIVE
Comment: NEGATIVE
Comment: NORMAL
Neisseria Gonorrhea: NEGATIVE
Trichomonas: NEGATIVE

## 2022-07-03 ENCOUNTER — Other Ambulatory Visit: Payer: Self-pay

## 2022-07-03 ENCOUNTER — Emergency Department (HOSPITAL_COMMUNITY): Payer: Self-pay

## 2022-07-03 ENCOUNTER — Inpatient Hospital Stay (HOSPITAL_COMMUNITY)
Admission: EM | Admit: 2022-07-03 | Discharge: 2022-07-09 | DRG: 917 | Disposition: A | Discharge status: Home / Self Care | Payer: Self-pay | Attending: Family Medicine | Admitting: Family Medicine

## 2022-07-03 DIAGNOSIS — E875 Hyperkalemia: Secondary | ICD-10-CM | POA: Diagnosis present

## 2022-07-03 DIAGNOSIS — A419 Sepsis, unspecified organism: Secondary | ICD-10-CM | POA: Diagnosis present

## 2022-07-03 DIAGNOSIS — N179 Acute kidney failure, unspecified: Secondary | ICD-10-CM

## 2022-07-03 DIAGNOSIS — E8729 Other acidosis: Secondary | ICD-10-CM | POA: Diagnosis present

## 2022-07-03 DIAGNOSIS — F1910 Other psychoactive substance abuse, uncomplicated: Secondary | ICD-10-CM | POA: Insufficient documentation

## 2022-07-03 DIAGNOSIS — J9601 Acute respiratory failure with hypoxia: Secondary | ICD-10-CM | POA: Diagnosis present

## 2022-07-03 DIAGNOSIS — N170 Acute kidney failure with tubular necrosis: Secondary | ICD-10-CM | POA: Diagnosis present

## 2022-07-03 DIAGNOSIS — T424X1A Poisoning by benzodiazepines, accidental (unintentional), initial encounter: Secondary | ICD-10-CM | POA: Diagnosis present

## 2022-07-03 DIAGNOSIS — I469 Cardiac arrest, cause unspecified: Secondary | ICD-10-CM

## 2022-07-03 DIAGNOSIS — I468 Cardiac arrest due to other underlying condition: Secondary | ICD-10-CM | POA: Diagnosis present

## 2022-07-03 DIAGNOSIS — I82612 Acute embolism and thrombosis of superficial veins of left upper extremity: Secondary | ICD-10-CM | POA: Diagnosis present

## 2022-07-03 DIAGNOSIS — K7200 Acute and subacute hepatic failure without coma: Secondary | ICD-10-CM | POA: Diagnosis present

## 2022-07-03 DIAGNOSIS — R6521 Severe sepsis with septic shock: Secondary | ICD-10-CM | POA: Diagnosis present

## 2022-07-03 DIAGNOSIS — Z6841 Body Mass Index (BMI) 40.0 and over, adult: Secondary | ICD-10-CM

## 2022-07-03 DIAGNOSIS — E876 Hypokalemia: Secondary | ICD-10-CM | POA: Diagnosis present

## 2022-07-03 DIAGNOSIS — R579 Shock, unspecified: Secondary | ICD-10-CM

## 2022-07-03 DIAGNOSIS — R220 Localized swelling, mass and lump, head: Secondary | ICD-10-CM | POA: Insufficient documentation

## 2022-07-03 DIAGNOSIS — E872 Acidosis, unspecified: Secondary | ICD-10-CM

## 2022-07-03 DIAGNOSIS — R4189 Other symptoms and signs involving cognitive functions and awareness: Principal | ICD-10-CM

## 2022-07-03 DIAGNOSIS — G928 Other toxic encephalopathy: Secondary | ICD-10-CM | POA: Diagnosis present

## 2022-07-03 DIAGNOSIS — G9341 Metabolic encephalopathy: Secondary | ICD-10-CM | POA: Insufficient documentation

## 2022-07-03 DIAGNOSIS — T402X1A Poisoning by other opioids, accidental (unintentional), initial encounter: Secondary | ICD-10-CM | POA: Diagnosis present

## 2022-07-03 DIAGNOSIS — J690 Pneumonitis due to inhalation of food and vomit: Secondary | ICD-10-CM | POA: Diagnosis present

## 2022-07-03 DIAGNOSIS — F1410 Cocaine abuse, uncomplicated: Secondary | ICD-10-CM | POA: Diagnosis present

## 2022-07-03 DIAGNOSIS — I2489 Other forms of acute ischemic heart disease: Secondary | ICD-10-CM | POA: Diagnosis present

## 2022-07-03 DIAGNOSIS — Z20822 Contact with and (suspected) exposure to covid-19: Secondary | ICD-10-CM | POA: Diagnosis present

## 2022-07-03 DIAGNOSIS — J9602 Acute respiratory failure with hypercapnia: Secondary | ICD-10-CM | POA: Diagnosis present

## 2022-07-03 DIAGNOSIS — T405X1A Poisoning by cocaine, accidental (unintentional), initial encounter: Principal | ICD-10-CM | POA: Diagnosis present

## 2022-07-03 DIAGNOSIS — J9819 Other pulmonary collapse: Secondary | ICD-10-CM | POA: Diagnosis present

## 2022-07-03 DIAGNOSIS — J45909 Unspecified asthma, uncomplicated: Secondary | ICD-10-CM | POA: Diagnosis present

## 2022-07-03 DIAGNOSIS — I82622 Acute embolism and thrombosis of deep veins of left upper extremity: Secondary | ICD-10-CM | POA: Diagnosis present

## 2022-07-03 LAB — I-STAT VENOUS BLOOD GAS, ED
Acid-base deficit: 6 mmol/L — ABNORMAL HIGH (ref 0.0–2.0)
Bicarbonate: 25.6 mmol/L (ref 20.0–28.0)
Calcium, Ion: 1.21 mmol/L (ref 1.15–1.40)
HCT: 44 % (ref 39.0–52.0)
Hemoglobin: 15 g/dL (ref 13.0–17.0)
O2 Saturation: 22 %
Potassium: 5.5 mmol/L — ABNORMAL HIGH (ref 3.5–5.1)
Sodium: 139 mmol/L (ref 135–145)
TCO2: 28 mmol/L (ref 22–32)
pCO2, Ven: 82 mmHg (ref 44–60)
pH, Ven: 7.103 — CL (ref 7.25–7.43)
pO2, Ven: 22 mmHg — CL (ref 32–45)

## 2022-07-03 LAB — CBC WITH DIFFERENTIAL/PLATELET
Abs Immature Granulocytes: 0.22 10*3/uL — ABNORMAL HIGH (ref 0.00–0.07)
Basophils Absolute: 0 10*3/uL (ref 0.0–0.1)
Basophils Relative: 0 %
Eosinophils Absolute: 0 10*3/uL (ref 0.0–0.5)
Eosinophils Relative: 0 %
HCT: 43.7 % (ref 39.0–52.0)
Hemoglobin: 14.4 g/dL (ref 13.0–17.0)
Immature Granulocytes: 1 %
Lymphocytes Relative: 5 %
Lymphs Abs: 0.9 10*3/uL (ref 0.7–4.0)
MCH: 26.2 pg (ref 26.0–34.0)
MCHC: 33 g/dL (ref 30.0–36.0)
MCV: 79.5 fL — ABNORMAL LOW (ref 80.0–100.0)
Monocytes Absolute: 0.9 10*3/uL (ref 0.1–1.0)
Monocytes Relative: 6 %
Neutro Abs: 14.5 10*3/uL — ABNORMAL HIGH (ref 1.7–7.7)
Neutrophils Relative %: 88 %
Platelets: 282 10*3/uL (ref 150–400)
RBC: 5.5 MIL/uL (ref 4.22–5.81)
RDW: 15.2 % (ref 11.5–15.5)
WBC: 16.5 10*3/uL — ABNORMAL HIGH (ref 4.0–10.5)
nRBC: 0.1 % (ref 0.0–0.2)

## 2022-07-03 LAB — I-STAT CHEM 8, ED
BUN: 26 mg/dL — ABNORMAL HIGH (ref 6–20)
Calcium, Ion: 1.2 mmol/L (ref 1.15–1.40)
Chloride: 103 mmol/L (ref 98–111)
Creatinine, Ser: 1.4 mg/dL — ABNORMAL HIGH (ref 0.61–1.24)
Glucose, Bld: 159 mg/dL — ABNORMAL HIGH (ref 70–99)
HCT: 45 % (ref 39.0–52.0)
Hemoglobin: 15.3 g/dL (ref 13.0–17.0)
Potassium: 5.5 mmol/L — ABNORMAL HIGH (ref 3.5–5.1)
Sodium: 139 mmol/L (ref 135–145)
TCO2: 28 mmol/L (ref 22–32)

## 2022-07-03 LAB — COMPREHENSIVE METABOLIC PANEL
ALT: 92 U/L — ABNORMAL HIGH (ref 0–44)
AST: 103 U/L — ABNORMAL HIGH (ref 15–41)
Albumin: 3.6 g/dL (ref 3.5–5.0)
Alkaline Phosphatase: 73 U/L (ref 38–126)
Anion gap: 10 (ref 5–15)
BUN: 20 mg/dL (ref 6–20)
CO2: 23 mmol/L (ref 22–32)
Calcium: 9.2 mg/dL (ref 8.9–10.3)
Chloride: 105 mmol/L (ref 98–111)
Creatinine, Ser: 1.47 mg/dL — ABNORMAL HIGH (ref 0.61–1.24)
GFR, Estimated: >60 mL/min (ref >60)
Glucose, Bld: 165 mg/dL — ABNORMAL HIGH (ref 70–99)
Potassium: 5.6 mmol/L — ABNORMAL HIGH (ref 3.5–5.1)
Sodium: 138 mmol/L (ref 135–145)
Total Bilirubin: 0.2 mg/dL — ABNORMAL LOW (ref 0.3–1.2)
Total Protein: 7 g/dL (ref 6.5–8.1)

## 2022-07-03 LAB — I-STAT ARTERIAL BLOOD GAS, ED
Acid-base deficit: 7 mmol/L — ABNORMAL HIGH (ref 0.0–2.0)
Bicarbonate: 20 mmol/L (ref 20.0–28.0)
Calcium, Ion: 1.46 mmol/L — ABNORMAL HIGH (ref 1.15–1.40)
HCT: 43 % (ref 39.0–52.0)
Hemoglobin: 14.6 g/dL (ref 13.0–17.0)
O2 Saturation: 80 %
Patient temperature: 97.4
Potassium: 4.1 mmol/L (ref 3.5–5.1)
Sodium: 138 mmol/L (ref 135–145)
TCO2: 21 mmol/L — ABNORMAL LOW (ref 22–32)
pCO2 arterial: 41.8 mmHg (ref 32–48)
pH, Arterial: 7.285 — ABNORMAL LOW (ref 7.35–7.45)
pO2, Arterial: 48 mmHg — ABNORMAL LOW (ref 83–108)

## 2022-07-03 LAB — PROTIME-INR
INR: 1 (ref 0.8–1.2)
Prothrombin Time: 13.2 seconds (ref 11.4–15.2)

## 2022-07-03 LAB — D-DIMER, QUANTITATIVE: D-Dimer, Quant: 1 ug/mL-FEU — ABNORMAL HIGH (ref 0.00–0.50)

## 2022-07-03 LAB — COOXEMETRY PANEL
Carboxyhemoglobin: 0.8 % (ref 0.5–1.5)
Methemoglobin: 1.4 % (ref 0.0–1.5)
O2 Saturation: 64.3 %
Total hemoglobin: 15.6 g/dL (ref 12.0–16.0)

## 2022-07-03 LAB — TROPONIN I (HIGH SENSITIVITY): Troponin I (High Sensitivity): 460 ng/L (ref <18)

## 2022-07-03 LAB — MAGNESIUM: Magnesium: 2.2 mg/dL (ref 1.7–2.4)

## 2022-07-03 LAB — LACTIC ACID, PLASMA: Lactic Acid, Venous: 5.5 mmol/L (ref 0.5–1.9)

## 2022-07-03 LAB — POC OCCULT BLOOD, ED: Fecal Occult Bld: POSITIVE — AB

## 2022-07-03 MED ORDER — NOREPINEPHRINE 4 MG/250ML-% IV SOLN
0.0000 ug/min | INTRAVENOUS | Status: DC
  Administered 2022-07-03: 10 ug/min via INTRAVENOUS
  Administered 2022-07-04: 30 ug/min via INTRAVENOUS
  Administered 2022-07-04: 7 ug/min via INTRAVENOUS
  Administered 2022-07-04: 15 ug/min via INTRAVENOUS
  Administered 2022-07-04: 9 ug/min via INTRAVENOUS
  Filled 2022-07-03 (×6): qty 250

## 2022-07-03 MED ORDER — LACTATED RINGERS IV SOLN: INTRAVENOUS | Status: AC

## 2022-07-03 MED ORDER — ROCURONIUM BROMIDE 50 MG/5ML IV SOLN
INTRAVENOUS | Status: AC | PRN
  Administered 2022-07-03: 100 mg via INTRAVENOUS

## 2022-07-03 MED ORDER — MIDAZOLAM HCL 2 MG/2ML IJ SOLN: 2.0000 mg | INTRAMUSCULAR | Status: DC | PRN

## 2022-07-03 MED ORDER — PROPOFOL 1000 MG/100ML IV EMUL
INTRAVENOUS | Status: AC | PRN
  Administered 2022-07-03: 10 ug/kg/min via INTRAVENOUS

## 2022-07-03 MED ORDER — ETOMIDATE 2 MG/ML IV SOLN
INTRAVENOUS | Status: AC | PRN
  Administered 2022-07-03 (×2): 20 mg via INTRAVENOUS

## 2022-07-03 MED ORDER — PROPOFOL 1000 MG/100ML IV EMUL
0.0000 ug/kg/min | INTRAVENOUS | Status: DC
  Administered 2022-07-03: 10 ug/kg/min via INTRAVENOUS

## 2022-07-03 MED ORDER — CALCIUM CHLORIDE 10 % IV SOLN
INTRAVENOUS | Status: AC | PRN
  Administered 2022-07-03: 1 g via INTRAVENOUS

## 2022-07-03 MED ORDER — EPINEPHRINE 1 MG/10ML IJ SOSY
PREFILLED_SYRINGE | INTRAMUSCULAR | Status: AC | PRN
  Administered 2022-07-03 (×2): 1 mg via INTRAVENOUS

## 2022-07-03 MED ORDER — NALOXONE HCL 2 MG/2ML IJ SOSY
PREFILLED_SYRINGE | INTRAMUSCULAR | Status: AC | PRN
  Administered 2022-07-03: 2 mg via INTRAVENOUS

## 2022-07-03 NOTE — ED Provider Notes (Signed)
[facility] DEPARTMENT Provider Note   CSN: [id number] Arrival date & time: 07/03/22  2143     History {Add pertinent medical, surgical, social history, OB history to HPI:1} Chief Complaint  Patient presents with   Unresponsive    Jeffery Blackwell is a 41 y.o. male.  HPI Patient resents for unresponsiveness.  He was seen by bystanders sitting in his car.  He was found to be sitting in his car for several hours.  Eventually, someone checked on him and found that he was unresponsive.  When EMS arrived on scene, he was still sitting upright in the car seat.  Although the car was initially running, engine had stopped by the time EMS arrived.  Patient initially had agonal breathing and pinpoint pupils.  1 mg of IV Narcan was given with improvement in his breathing but no improvement in his responsiveness.  Patient was then noted to have decorticate posturing.  He was given 2.5 mg of intravenous Versed.  He was kept on supplemental oxygen.  He maintained spontaneous respirations.  He had no change in his mental status during transit to the hospital.    Home Medications Prior to Admission medications   Medication Sig Start Date End Date Taking? Authorizing Provider  albuterol (VENTOLIN HFA) 108 (90 Base) MCG/ACT inhaler Inhale 1-2 puffs into the lungs every 6 (six) hours as needed for wheezing or shortness of breath. 09/02/21   Francene Finders, PA-C  cetirizine (ZYRTEC ALLERGY) 10 MG tablet Take 1 tablet (10 mg total) by mouth daily. Patient not taking: Reported on 08/30/2021 03/20/20   Hall-Potvin, [place], PA-C  doxycycline (VIBRAMYCIN) 100 MG capsule Take 1 capsule (100 mg total) by mouth 2 (two) times daily. 11/17/21   Teodora Medici, FNP  ondansetron (ZOFRAN-ODT) 4 MG disintegrating tablet Take 1 tablet (4 mg total) by mouth every 8 (eight) hours as needed for nausea or vomiting. 09/02/21   Francene Finders, PA-C      Allergies    Ibuprofen    Review of  Systems   Review of Systems  Unable to perform ROS: Patient unresponsive    Physical Exam Updated Vital Signs There were no vitals taken for this visit. Physical Exam Constitutional:      Appearance: He is obese. He is ill-appearing.  HENT:     Head: Normocephalic and atraumatic.     Right Ear: External ear normal.     Left Ear: External ear normal.     Nose: Nose normal.     Mouth/Throat:     Mouth: Mucous membranes are moist.     Pharynx: Oropharynx is clear.  Eyes:     Comments: Pupils are equal, 2 mm bilaterally and nonreactive  Cardiovascular:     Rate and Rhythm: Normal rate and regular rhythm.     Heart sounds: No murmur heard. Pulmonary:     Breath sounds: No stridor. Rhonchi present. No wheezing.  Abdominal:     General: There is no distension.     Palpations: Abdomen is soft.  Musculoskeletal:        General: No deformity.     Cervical back: Neck supple.     Right lower leg: No edema.     Left lower leg: No edema.  Skin:    General: Skin is cool and dry.  Neurological:     GCS: GCS eye subscore is 1. GCS verbal subscore is 1. GCS motor subscore is 1.     ED Results /  Procedures / Treatments   Labs (all labs ordered are listed, but only abnormal results are displayed) Labs Reviewed - No data to display  EKG None  Radiology No results found.  Procedures Procedures  {Document cardiac monitor, telemetry assessment procedure when appropriate:1}  Medications Ordered in ED Medications  etomidate (AMIDATE) injection (20 mg Intravenous Given 07/03/22 2159)  rocuronium [facility]) injection (100 mg Intravenous Given 07/03/22 2200)  naloxone [facility]) injection (2 mg Intravenous Given 07/03/22 2150)    ED Course/ Medical Decision Making/ A&P                           Medical Decision Making Amount and/or Complexity of Data Reviewed Labs: ordered. Radiology: ordered. ECG/medicine tests: ordered.  Risk Prescription drug management.   This patient  presents to the ED for concern of ***, this involves an extensive number of treatment options, and is a complaint that carries with it a high risk of complications and morbidity.  The differential diagnosis includes ***   Co morbidities that complicate the patient evaluation  ***   Additional history obtained:  Additional history obtained from *** External records from outside source obtained and reviewed including ***   Lab Tests:  I Ordered, and personally interpreted labs.  The pertinent results include:  ***   Imaging Studies ordered:  I ordered imaging studies including ***  I independently visualized and interpreted imaging which showed *** I agree with the radiologist interpretation   Cardiac Monitoring: / EKG:  The patient was maintained on a cardiac monitor.  I personally viewed and interpreted the cardiac monitored which showed an underlying rhythm of: ***   Consultations Obtained:  I requested consultation with the ***,  and discussed lab and imaging findings as well as pertinent plan - they recommend: ***   Problem List / ED Course / Critical interventions / Medication management  Patient presents to the ED via EMS after being found unresponsive, sitting in a car.  Last known well was this morning.  He was witnessed to be sitting in his car for several hours prior to someone actually checking on him.  When they did check on him, he was unresponsive.  EMS noted GCS of 3 on scene.  He was given 1 mg of IV Narcan and 2.5 mg of IV Versed prior to arrival.  On arrival, patient remains GCS of 3.  Extremities are cool to touch.  He does have spontaneous respirations at an elevated rate.  SPO2 is normal on 15 L by nonrebreather.  He has coarse breath sounds bilaterally.  There are no external signs of trauma.  He is normotensive and tachycardic.  Patient was intubated for airway protection.  Broad diagnostic work-up was initiated.  IV fluids were started.  OG tube was placed  and did have dark bloody contents.  After 1 hour in the ED, patient became hypotensive.  At this time, he maintained in normal sinus rhythm.  Levophed was initiated.  Patient became hypotensive and ultimately, lost pulses.  CPR was initiated.  Patient received approximately 5 minutes of CPR.  Two 1 mg doses of epinephrine were given.  Following this, patient had ROSC.  Blood pressure at this time is elevated in the range of 220/120.  Following ROSC, patient had changes in EKG.  1 mg of calcium was given.  Blood gas shows a severe respiratory acidosis.  Respiratory rate on ventilator was increased. *** I ordered medication including ***  for ***  Reevaluation of the patient after these medicines showed that the patient {resolved/improved/worsened:23923::"improved"} I have reviewed the patients home medicines and have made adjustments as needed   Social Determinants of Health:  ***   Test / Admission - Considered:  ***   {Document critical care time when appropriate:1} {Document review of labs and clinical decision tools ie heart score, Chads2Vasc2 etc:1}  {Document your independent review of radiology images, and any outside records:1} {Document your discussion with family members, caretakers, and with consultants:1} {Document social determinants of health affecting pt's care:1} {Document your decision making why or why not admission, treatments were needed:1} Final Clinical Impression(s) / ED Diagnoses Final diagnoses:  None    Rx / DC Orders ED Discharge Orders     None

## 2022-07-03 NOTE — Care Plan (Signed)
Went to get pt. For CT, pt has declined and is now intubated. RN to call when able to bring to CT.

## 2022-07-03 NOTE — ED Triage Notes (Signed)
Pt BIB EMS from car sitting in apartment parking lot. Pt was sitting in car for approx. 2 hours with car running when someone checked on him and found him unresponsive car was off on EMS arrival. Agonal respirations and pinpoint pupils upon EMS arrival. Given 1 of narcan IV pt presented with decorticate posturing and clinched jaw 2.5 of versed given IV. Hx of cocaine use per friend on scene.    160/90 HR 110 RR 30 98% NRB NPA in place

## 2022-07-03 NOTE — ED Notes (Signed)
MD Doren Custard made aware of lactic 5.5 and Troponin 460

## 2022-07-03 NOTE — ED Notes (Signed)
MD Doren Custard made aware of BP. Orders for levophed placed.

## 2022-07-04 ENCOUNTER — Emergency Department (HOSPITAL_COMMUNITY): Payer: Self-pay

## 2022-07-04 ENCOUNTER — Inpatient Hospital Stay (HOSPITAL_COMMUNITY): Payer: Self-pay

## 2022-07-04 DIAGNOSIS — J9602 Acute respiratory failure with hypercapnia: Secondary | ICD-10-CM

## 2022-07-04 DIAGNOSIS — E875 Hyperkalemia: Secondary | ICD-10-CM

## 2022-07-04 DIAGNOSIS — E872 Acidosis, unspecified: Secondary | ICD-10-CM

## 2022-07-04 DIAGNOSIS — N179 Acute kidney failure, unspecified: Secondary | ICD-10-CM

## 2022-07-04 DIAGNOSIS — R579 Shock, unspecified: Secondary | ICD-10-CM

## 2022-07-04 DIAGNOSIS — J9601 Acute respiratory failure with hypoxia: Secondary | ICD-10-CM

## 2022-07-04 DIAGNOSIS — R4182 Altered mental status, unspecified: Secondary | ICD-10-CM

## 2022-07-04 DIAGNOSIS — I469 Cardiac arrest, cause unspecified: Secondary | ICD-10-CM | POA: Diagnosis present

## 2022-07-04 LAB — LACTIC ACID, PLASMA
Lactic Acid, Venous: 5.9 mmol/L (ref 0.5–1.9)
Lactic Acid, Venous: 2.1 mmol/L (ref 0.5–1.9)

## 2022-07-04 LAB — GLUCOSE, CAPILLARY
Glucose-Capillary: 121 mg/dL — ABNORMAL HIGH (ref 70–99)
Glucose-Capillary: 167 mg/dL — ABNORMAL HIGH (ref 70–99)

## 2022-07-04 LAB — CBC
HCT: 38.9 % — ABNORMAL LOW (ref 39.0–52.0)
Hemoglobin: 13.5 g/dL (ref 13.0–17.0)
MCH: 26 pg (ref 26.0–34.0)
MCHC: 34.7 g/dL (ref 30.0–36.0)
MCV: 75 fL — ABNORMAL LOW (ref 80.0–100.0)
Platelets: 291 10*3/uL (ref 150–400)
RBC: 5.19 MIL/uL (ref 4.22–5.81)
RDW: 14.6 % (ref 11.5–15.5)
WBC: 12.9 10*3/uL — ABNORMAL HIGH (ref 4.0–10.5)
nRBC: 0 % (ref 0.0–0.2)

## 2022-07-04 LAB — BLOOD CULTURE ID PANEL (REFLEXED) - BCID2

## 2022-07-04 LAB — RESP PANEL BY RT-PCR (FLU A&B, COVID) ARPGX2
Influenza A by PCR: NEGATIVE
Influenza B by PCR: NEGATIVE
SARS Coronavirus 2 by RT PCR: NEGATIVE

## 2022-07-04 LAB — URINALYSIS, ROUTINE W REFLEX MICROSCOPIC
Bilirubin Urine: NEGATIVE
Glucose, UA: 150 mg/dL — AB
Ketones, ur: NEGATIVE mg/dL
Nitrite: NEGATIVE
Protein, ur: 100 mg/dL — AB
Specific Gravity, Urine: 1.015 (ref 1.005–1.030)
pH: 5 (ref 5.0–8.0)

## 2022-07-04 LAB — CREATININE, SERUM
Creatinine, Ser: 1.16 mg/dL (ref 0.61–1.24)
GFR, Estimated: >60 mL/min (ref >60)

## 2022-07-04 LAB — CBG MONITORING, ED: Glucose-Capillary: 108 mg/dL — ABNORMAL HIGH (ref 70–99)

## 2022-07-04 LAB — RAPID URINE DRUG SCREEN, HOSP PERFORMED
Amphetamines: NOT DETECTED
Barbiturates: NOT DETECTED
Benzodiazepines: POSITIVE — AB
Cocaine: POSITIVE — AB
Opiates: POSITIVE — AB
Tetrahydrocannabinol: NOT DETECTED

## 2022-07-04 LAB — HIV ANTIBODY (ROUTINE TESTING W REFLEX): HIV Screen 4th Generation wRfx: NONREACTIVE

## 2022-07-04 LAB — TROPONIN I (HIGH SENSITIVITY): Troponin I (High Sensitivity): 1022 ng/L (ref <18)

## 2022-07-04 LAB — BRAIN NATRIURETIC PEPTIDE: B Natriuretic Peptide: 37.3 pg/mL (ref 0.0–100.0)

## 2022-07-04 MED ORDER — EPINEPHRINE 1 MG/10ML IJ SOSY
PREFILLED_SYRINGE | INTRAMUSCULAR | Status: AC | PRN
  Administered 2022-07-04: 1 mg via INTRAVENOUS

## 2022-07-04 MED ORDER — FENTANYL 2500MCG IN NS 250ML (10MCG/ML) PREMIX INFUSION
0.0000 ug/h | INTRAVENOUS | Status: DC
  Administered 2022-07-04: 100 ug/h via INTRAVENOUS

## 2022-07-04 MED ORDER — IOHEXOL 350 MG/ML SOLN
100.0000 mL | Freq: Once | INTRAVENOUS | Status: AC | PRN
  Administered 2022-07-04: 100 mL via INTRAVENOUS

## 2022-07-04 MED ORDER — BUSPIRONE HCL 10 MG PO TABS: 30.0000 mg | ORAL_TABLET | Freq: Three times a day (TID) | ORAL | Status: DC | PRN

## 2022-07-04 MED ORDER — ATORVASTATIN CALCIUM 40 MG PO TABS
40.0000 mg | ORAL_TABLET | Freq: Every day | ORAL | Status: DC
  Administered 2022-07-04 – 2022-07-09 (×6): 40 mg
  Filled 2022-07-04 (×6): qty 1

## 2022-07-04 MED ORDER — FENTANYL BOLUS VIA INFUSION
50.0000 ug | INTRAVENOUS | Status: DC | PRN
  Administered 2022-07-04: 50 ug via INTRAVENOUS

## 2022-07-04 MED ORDER — FENTANYL 2500MCG IN NS 250ML (10MCG/ML) PREMIX INFUSION
50.0000 ug/h | INTRAVENOUS | Status: DC
  Administered 2022-07-04 – 2022-07-05 (×2): 200 ug/h via INTRAVENOUS
  Filled 2022-07-04 (×2): qty 250

## 2022-07-04 MED ORDER — SODIUM CHLORIDE 0.9 % IV SOLN
2.0000 g | Freq: Once | INTRAVENOUS | Status: AC
  Administered 2022-07-04: 2 g via INTRAVENOUS
  Filled 2022-07-04: qty 12.5

## 2022-07-04 MED ORDER — ACETAMINOPHEN 650 MG RE SUPP: 650.0000 mg | RECTAL | Status: DC | PRN

## 2022-07-04 MED ORDER — SODIUM CHLORIDE 0.9 % IV BOLUS
1000.0000 mL | Freq: Once | INTRAVENOUS | Status: AC
  Administered 2022-07-04: 1000 mL via INTRAVENOUS

## 2022-07-04 MED ORDER — ROCURONIUM BROMIDE 50 MG/5ML IV SOLN
100.0000 mg | Freq: Once | INTRAVENOUS | Status: AC
  Administered 2022-07-04: 100 mg via INTRAVENOUS
  Filled 2022-07-04: qty 10

## 2022-07-04 MED ORDER — FENTANYL CITRATE PF 50 MCG/ML IJ SOSY
50.0000 ug | PREFILLED_SYRINGE | Freq: Once | INTRAMUSCULAR | Status: AC
  Administered 2022-07-04: 50 ug via INTRAVENOUS

## 2022-07-04 MED ORDER — INSULIN ASPART 100 UNIT/ML IV SOLN
5.0000 [IU] | Freq: Once | INTRAVENOUS | Status: AC
  Administered 2022-07-04: 5 [IU] via INTRAVENOUS

## 2022-07-04 MED ORDER — POLYETHYLENE GLYCOL 3350 17 G PO PACK
17.0000 g | PACK | Freq: Every day | ORAL | Status: DC
  Administered 2022-07-04 – 2022-07-06 (×3): 17 g
  Filled 2022-07-04 (×5): qty 1

## 2022-07-04 MED ORDER — SODIUM ZIRCONIUM CYCLOSILICATE 10 G PO PACK
10.0000 g | PACK | Freq: Once | ORAL | Status: AC
  Administered 2022-07-04: 10 g
  Filled 2022-07-04: qty 1

## 2022-07-04 MED ORDER — ACETAMINOPHEN 325 MG PO TABS: 650.0000 mg | ORAL_TABLET | ORAL | Status: DC

## 2022-07-04 MED ORDER — ROCURONIUM BROMIDE 10 MG/ML (PF) SYRINGE
PREFILLED_SYRINGE | INTRAVENOUS | Status: AC
  Filled 2022-07-04: qty 10

## 2022-07-04 MED ORDER — DOCUSATE SODIUM 50 MG/5ML PO LIQD
100.0000 mg | Freq: Two times a day (BID) | ORAL | Status: DC
  Administered 2022-07-04 – 2022-07-05 (×4): 100 mg
  Filled 2022-07-04 (×3): qty 10

## 2022-07-04 MED ORDER — ACETAMINOPHEN 325 MG PO TABS
650.0000 mg | ORAL_TABLET | ORAL | Status: DC | PRN
  Administered 2022-07-05: 650 mg via ORAL
  Filled 2022-07-04: qty 2

## 2022-07-04 MED ORDER — ETOMIDATE 2 MG/ML IV SOLN
20.0000 mg | Freq: Once | INTRAVENOUS | Status: AC
  Administered 2022-07-04: 20 mg via INTRAVENOUS
  Filled 2022-07-04: qty 10

## 2022-07-04 MED ORDER — DEXMEDETOMIDINE HCL IN NACL 400 MCG/100ML IV SOLN
0.0000 ug/kg/h | INTRAVENOUS | Status: DC
  Administered 2022-07-04 (×4): 0.8 ug/kg/h via INTRAVENOUS
  Administered 2022-07-04: 0.4 ug/kg/h via INTRAVENOUS
  Administered 2022-07-05: 0.8 ug/kg/h via INTRAVENOUS
  Administered 2022-07-05: 0.9 ug/kg/h via INTRAVENOUS
  Filled 2022-07-04 (×7): qty 100

## 2022-07-04 MED ORDER — ONDANSETRON HCL 4 MG/2ML IJ SOLN
4.0000 mg | Freq: Four times a day (QID) | INTRAMUSCULAR | Status: DC | PRN
  Administered 2022-07-05: 4 mg via INTRAVENOUS
  Filled 2022-07-04: qty 2

## 2022-07-04 MED ORDER — ORAL CARE MOUTH RINSE
15.0000 mL | OROMUCOSAL | Status: DC
  Administered 2022-07-04 – 2022-07-05 (×12): 15 mL via OROMUCOSAL

## 2022-07-04 MED ORDER — LACTATED RINGERS IV BOLUS
1000.0000 mL | Freq: Once | INTRAVENOUS | Status: AC
  Administered 2022-07-04: 1000 mL via INTRAVENOUS

## 2022-07-04 MED ORDER — PANTOPRAZOLE 2 MG/ML SUSPENSION
40.0000 mg | Freq: Every day | ORAL | Status: DC
  Administered 2022-07-04: 40 mg
  Filled 2022-07-04 (×2): qty 20

## 2022-07-04 MED ORDER — VANCOMYCIN HCL IN DEXTROSE 1-5 GM/200ML-% IV SOLN: 1000.0000 mg | Freq: Two times a day (BID) | INTRAVENOUS | Status: DC

## 2022-07-04 MED ORDER — SODIUM CHLORIDE 0.9 % IV SOLN: 250.0000 mL | INTRAVENOUS | Status: DC

## 2022-07-04 MED ORDER — VANCOMYCIN HCL IN DEXTROSE 1-5 GM/200ML-% IV SOLN: 1000.0000 mg | Freq: Once | INTRAVENOUS | Status: DC

## 2022-07-04 MED ORDER — VANCOMYCIN HCL 2000 MG/400ML IV SOLN
2000.0000 mg | Freq: Once | INTRAVENOUS | Status: AC
  Administered 2022-07-04: 2000 mg via INTRAVENOUS
  Filled 2022-07-04: qty 400

## 2022-07-04 MED ORDER — METRONIDAZOLE 500 MG/100ML IV SOLN
500.0000 mg | Freq: Once | INTRAVENOUS | Status: AC
  Administered 2022-07-04: 500 mg via INTRAVENOUS
  Filled 2022-07-04: qty 100

## 2022-07-04 MED ORDER — LACTATED RINGERS IV BOLUS: 1000.0000 mL | Freq: Once | INTRAVENOUS | Status: DC

## 2022-07-04 MED ORDER — DEXTROSE 50 % IV SOLN
1.0000 | Freq: Once | INTRAVENOUS | Status: AC
  Administered 2022-07-04: 50 mL via INTRAVENOUS
  Filled 2022-07-04: qty 50

## 2022-07-04 MED ORDER — ACETAMINOPHEN 160 MG/5ML PO SOLN: 650.0000 mg | ORAL | Status: DC | PRN

## 2022-07-04 MED ORDER — FENTANYL 2500MCG IN NS 250ML (10MCG/ML) PREMIX INFUSION
INTRAVENOUS | Status: AC
  Filled 2022-07-04: qty 250

## 2022-07-04 MED ORDER — ACETAMINOPHEN 650 MG RE SUPP
650.0000 mg | RECTAL | Status: DC
  Administered 2022-07-04: 650 mg via RECTAL
  Filled 2022-07-04: qty 1

## 2022-07-04 MED ORDER — ORAL CARE MOUTH RINSE: 15.0000 mL | OROMUCOSAL | Status: DC | PRN

## 2022-07-04 MED ORDER — HEPARIN SODIUM (PORCINE) 5000 UNIT/ML IJ SOLN
5000.0000 [IU] | Freq: Three times a day (TID) | INTRAMUSCULAR | Status: DC
  Administered 2022-07-04 – 2022-07-07 (×10): 5000 [IU] via SUBCUTANEOUS
  Filled 2022-07-04 (×11): qty 1

## 2022-07-04 MED ORDER — CLOPIDOGREL BISULFATE 75 MG PO TABS
75.0000 mg | ORAL_TABLET | Freq: Every day | ORAL | Status: DC
  Administered 2022-07-04 – 2022-07-06 (×3): 75 mg
  Filled 2022-07-04 (×3): qty 1

## 2022-07-04 MED ORDER — ACETAMINOPHEN 160 MG/5ML PO SOLN
650.0000 mg | ORAL | Status: DC
  Administered 2022-07-04 – 2022-07-05 (×6): 650 mg
  Filled 2022-07-04 (×7): qty 20.3

## 2022-07-04 MED ORDER — VASOPRESSIN 20 UNITS/100 ML INFUSION FOR SHOCK
0.0000 [IU]/min | INTRAVENOUS | Status: DC
  Administered 2022-07-04: 0.03 [IU]/min via INTRAVENOUS
  Filled 2022-07-04: qty 100

## 2022-07-04 MED ORDER — CHLORHEXIDINE GLUCONATE CLOTH 2 % EX PADS
6.0000 | MEDICATED_PAD | Freq: Every day | CUTANEOUS | Status: DC
  Administered 2022-07-04 – 2022-07-08 (×6): 6 via TOPICAL

## 2022-07-04 MED ORDER — PIPERACILLIN-TAZOBACTAM 3.375 G IVPB
3.3750 g | Freq: Three times a day (TID) | INTRAVENOUS | Status: DC
  Administered 2022-07-04 – 2022-07-07 (×9): 3.375 g via INTRAVENOUS
  Filled 2022-07-04 (×9): qty 50

## 2022-07-04 NOTE — Progress Notes (Signed)
Received from ED intubated with Levo and fentanyl drip infusing. SBP 60's on ED's monitor.Levo drip increse to 40 mics and vasopressing drip restarted. Patient open eyes to verbal command but unable to squeeze my hands.Pupils brisk  with corneals intact.

## 2022-07-04 NOTE — Progress Notes (Signed)
41 year old male with history of asthma who was found unresponsive in his car, went to PEA cardiac arrest in the setting of drug overdose, intubated after ROSC achieved and admitted to ICU   Patient started waking up, intermittently following commands Started spiking high-grade fever     Physical exam: General: Crtitically ill-appearing male, orally intubated HEENT: [place]/AT, eyes anicteric.  ETT and OGT in place Neuro: Opens eyes with painful stimuli, intermittently following few commands, moving all 4 extremities Chest: Coarse breath sounds, no wheezes or rhonchi Heart: Regular rate and rhythm, no murmurs or gallops Abdomen: Soft, nontender, nondistended, bowel sounds present Skin: No rash  Labs and images were reviewed  Assessment and plan: S/p PEA cardiac arrest likely due to hypoxia in the setting of drug overdose Polysubstance abuse Demand cardiac ischemia Septic shock due to aspiration pneumonia Lactic acidosis Acute septic/toxic encephalopathy Acute left basal ganglia ischemic stroke Acute hypoxic/hypercapnic respiratory failure Acute kidney injury due to septic ATN Hyperkalemia Shock liver Morbid obesity  Continue TTM Watch for signs of withdrawal Echocardiogram is pending Continue IV vasopressor support with map goal 65 Continue IV antibiotics Follow-up respiratory culture Lactic acid level is trending down Avoid deep sedation, currently on Precedex and as needed fentanyl Continue aspirin and atorvastatin We will call neurology once he is more awake Continue lung protective ventilation Hypercapnia has cleared Monitor intake and output Avoid nephrotoxic agent Serum potassium has corrected Trend LFTs Start tube feed  Total critical care time: 38 minutes  Performed by: Jacky Kindle   Critical care time was exclusive of separately billable procedures and treating other patients.   Critical care was necessary to treat or prevent imminent or life-threatening  deterioration.   Critical care was time spent personally by me on the following activities: development of treatment plan with patient and/or surrogate as well as nursing, discussions with consultants, evaluation of patient's response to treatment, examination of patient, obtaining history from patient or surrogate, ordering and performing treatments and interventions, ordering and review of laboratory studies, ordering and review of radiographic studies, pulse oximetry and re-evaluation of patient's condition.   Jacky Kindle, MD [facility] See Amion for pager If no response to pager, please call [phone] until 7pm After 7pm, Please call E-link [phone]

## 2022-07-04 NOTE — Progress Notes (Addendum)
Pharmacy Antibiotic Note  Jeffery Blackwell is a 41 y.o. male admitted on 07/03/2022 with sepsis.  Pharmacy has been consulted for vancomycin and Zosyn dosing.  Plan: Vancomycin 1000 IV every 12 hours. Goal AUC 400-550. Predicted AUC 438. Zosyn 3.375g IV q8h (4 hour infusion).  Height: 6' (182.9 cm) Weight: 135 kg (297 lb 9.9 oz) IBW/kg (Calculated) : 77.6  Temp (24hrs), Avg:99.7 F (37.6 C), Min:97.4 F (36.3 C), Max:100.7 F (38.2 C)  Recent Labs  Lab 07/03/22 2242 07/03/22 2251 07/04/22 0102  WBC 16.5*  --   --   CREATININE 1.47* 1.40*  --   LATICACIDVEN 5.5*  --  5.9*    Estimated Creatinine Clearance: 98.8 mL/min (A) (by C-G formula based on SCr of 1.4 mg/dL (H)).    Allergies  Allergen Reactions   Ibuprofen Swelling    Antimicrobials this admission: Cefepime x1 10/30 Metronidazole x1 10/30 Vancomycin 10/30 >>  Zosyn 10/30 >>   Microbiology results: 10/29 BCx: IP 10/29 UCx: IP  10/30 MRSA PCR: Pending  Thank you for allowing pharmacy to be a part of this patient's care.  Lerry Liner, PharmD Candidate class of 2024 07/04/2022 4:13 AM

## 2022-07-04 NOTE — Progress Notes (Addendum)
eLink Physician-Brief Progress Note Patient Name: Jeffery Blackwell DOB: July 24, 1981 MRN: [id number]   Date of Service  07/04/2022  HPI/Events of Note  Brief New admit note: 41 y.o. male, who presented to the [facility] ED by EMS after being found unresponsive in the car.   past medical history of asthma. In ED GCS 3, intubated. Post went into PEA arrest, ROSC in 5 mins. Another 2 mins of second PEA arrest.    Data: reviewed COOX panel within normal limits.  UDS positive for opioids, cocaine, benzos.  Lactate 5.5 > 5.9.   CT head: Age-indeterminate lacunar infarct within left globus pallidus CT cervical spine: Negative for acute injury CTA chest abdomen pelvis:  1. No evidence of thoracoabdominal aortic aneurysm or dissection. 2. Complete right upper lobe collapse as well as subtotal collapse of the lower lobes bilaterally, right greater than left. Extensive airway impaction within the right upper lobe and right lower lobe. 3. Mild sigmoid diverticulosis without superimposed acute inflammatory change. Twelve-lead: Sinus rhythm, no ST changes noted   Camera evaluation done: In synchrony with Vent. VT < 8 ml/ibw, PEEP 10. Sats 100%. Wbc 16 K.  UDS + cocaine, opioids, benzos. Covid/flu neg D dimer low at 1 Cr 1.4 Troponin , mostly type 2 from 460>1022 EKG: sinus. T down in 3 and avF. Non specific. Qtc 374  A/P:  Encephalopathy from poly substance drug abuse, asp pneumonia-AHRF related PEA arrest. On Ventilator. Lacunar infarcts, could be old.  AKI Asthma Hyperkalemia, lactemia Shock liver. - agree for current care plan. - TTM  but will keep temp to 36 degree. Responding to pain  - agree for plan of care, antibiotics, pressor. TTM.  VTE: Heparin  SQ.  CBG goals < 180. on SSI.  Vent bundle: SAT-SBT daily when stable hemodynamics. Lung protective ventilation.   Jeffery Blackwell. Neomia Glass, MD, FCCP. [phone]  eICU Interventions       Intervention Category Major Interventions:  Respiratory failure - evaluation and management;Other: (PEA arrest sepsis) Evaluation Type: New Patient Evaluation  Jeffery Blackwell 07/04/2022, 4:33 AM

## 2022-07-04 NOTE — Progress Notes (Signed)
CSW received consult regarding POC/HCPOA for patient. CSW spoke with patients sister Jeffery Blackwell regarding poc/HCPOA for patient. Felicia informed CSW that patient does not have a HCPOA.Felicia informed CSW that her and patients significant other Jeffery Blackwell can be contacted regarding patient. All questions answered. No further questions reported at this time.

## 2022-07-04 NOTE — ED Notes (Signed)
Lab called to add on UDS.

## 2022-07-04 NOTE — Procedures (Signed)
Patient Name: Jeffery Blackwell  MRN: [id number]  Epilepsy Attending: Lora Havens  Referring Physician/Provider: Estill Cotta, NP Date: 07/04/2022 Duration: 22.36 mins  Patient history: 41yo M with ams. EEG to evaluate for seizure.  Level of alertness: lethargic   AEDs during EEG study: None  Technical aspects: This EEG study was done with scalp electrodes positioned according to the 10-20 International system of electrode placement. Electrical activity was reviewed with band pass filter of 1-70Hz , sensitivity of 7 uV/mm, display speed of 52mm/sec with a 60Hz  notched filter applied as appropriate. EEG data were recorded continuously and digitally stored.  Video monitoring was available and reviewed as appropriate.  Description: EEG showed continuous generalized predominantly 5 to 7 Hz theta slowing admixed with intermittent generalized 2-3hz  delta slowing. Hyperventilation and photic stimulation were not performed.     ABNORMALITY - Continuous slow, generalized  IMPRESSION: This study is suggestive of moderate diffuse encephalopathy, nonspecific etiology. No seizures or epileptiform discharges were seen throughout the recording.  Gurfateh Mcclain Barbra Sarks

## 2022-07-04 NOTE — Progress Notes (Signed)
PHARMACY - PHYSICIAN COMMUNICATION CRITICAL VALUE ALERT - BLOOD CULTURE IDENTIFICATION (BCID)  Jeffery Blackwell is an 41 y.o. male who presented to [facility] on 07/03/2022 with a chief complaint of being found unresponsive   Assessment:  Streptococcus species in 1 of 4 bottles  Name of physician (or Provider) Contacted: Oletta Darter  Current antibiotics: Zosyn  Changes to prescribed antibiotics recommended:  Patient is on recommended antibiotics - No changes needed  Results for orders placed or performed during the hospital encounter of 07/03/22  Blood Culture ID Panel (Reflexed) (Collected: 07/03/2022 10:50 PM)  Result Value Ref Range   Enterococcus faecalis NOT DETECTED NOT DETECTED   Enterococcus Faecium NOT DETECTED NOT DETECTED   Listeria monocytogenes NOT DETECTED NOT DETECTED   Staphylococcus species NOT DETECTED NOT DETECTED   Staphylococcus aureus (BCID) NOT DETECTED NOT DETECTED   Staphylococcus epidermidis NOT DETECTED NOT DETECTED   Staphylococcus lugdunensis NOT DETECTED NOT DETECTED   Streptococcus species DETECTED (A) NOT DETECTED   Streptococcus agalactiae NOT DETECTED NOT DETECTED   Streptococcus pneumoniae NOT DETECTED NOT DETECTED   Streptococcus pyogenes NOT DETECTED NOT DETECTED   A.calcoaceticus-baumannii NOT DETECTED NOT DETECTED   Bacteroides fragilis NOT DETECTED NOT DETECTED   Enterobacterales NOT DETECTED NOT DETECTED   Enterobacter cloacae complex NOT DETECTED NOT DETECTED   Escherichia coli NOT DETECTED NOT DETECTED   Klebsiella aerogenes NOT DETECTED NOT DETECTED   Klebsiella oxytoca NOT DETECTED NOT DETECTED   Klebsiella pneumoniae NOT DETECTED NOT DETECTED   Proteus species NOT DETECTED NOT DETECTED   Salmonella species NOT DETECTED NOT DETECTED   Serratia marcescens NOT DETECTED NOT DETECTED   Haemophilus influenzae NOT DETECTED NOT DETECTED   Neisseria meningitidis NOT DETECTED NOT DETECTED   Pseudomonas aeruginosa NOT DETECTED NOT DETECTED    Stenotrophomonas maltophilia NOT DETECTED NOT DETECTED   Candida albicans NOT DETECTED NOT DETECTED   Candida auris NOT DETECTED NOT DETECTED   Candida glabrata NOT DETECTED NOT DETECTED   Candida krusei NOT DETECTED NOT DETECTED   Candida parapsilosis NOT DETECTED NOT DETECTED   Candida tropicalis NOT DETECTED NOT DETECTED   Cryptococcus neoformans/gattii NOT DETECTED NOT DETECTED    Jodean Lima Taniyah Ballow 07/04/2022  10:39 PM

## 2022-07-04 NOTE — Procedures (Signed)
Bronchoscopy Procedure Note  Jeffery Blackwell  [id number]  [birth year]  Date:07/04/22  Time:1:02 PM   Provider Performing:Mitsy Owen   Procedure(s):  Flexible bronchoscopy with bronchial alveolar lavage (02111)  Indication(s) Aspiration pneumonia with respiratory failure  Consent Unable to obtain consent due to emergent nature of procedure.  Anesthesia Etomidate and rocuronium   Time Out Verified patient identification, verified procedure, site/side was marked, verified correct patient position, special equipment/implants available, medications/allergies/relevant history reviewed, required imaging and test results available.   Sterile Technique Usual hand hygiene, masks, gowns, and gloves were used   Procedure Description Bronchoscope advanced through endotracheal tube and into airway.  Airways were examined down to subsegmental level with findings noted below.   Following diagnostic evaluation, BAL(s) performed in right upper lobe with normal saline and return of greenish fluid  Findings: Moderate amount of thick tenacious secretions noted throughout the respiratory tree, suction and BAL was performed in the right upper lobe   Complications/Tolerance None; patient tolerated the procedure well. Chest X-ray is needed post procedure.   EBL Minimal   Specimen(s) BAL

## 2022-07-04 NOTE — Progress Notes (Signed)
Notified bedside nurse of need to draw repeat lactic acid (#3). 

## 2022-07-04 NOTE — Progress Notes (Signed)
EEG complete - results pending 

## 2022-07-04 NOTE — H&P (Signed)
NAME:  Jeffery Blackwell, MRN:  [id number], DOB:  November 08, 1980, LOS: 0 ADMISSION DATE:  07/03/2022, CONSULTATION DATE:  10/30 REFERRING MD:  Mesner, CHIEF COMPLAINT:  unresponsive   History of Present Illness:  Patient is encephalopathic and/or intubated. Therefore history has been obtained from chart review.   Sergey Ishler, is a 41 y.o. male, who presented to the [facility] ED by EMS after being found unresponsive in the car.  They have a pertinent past medical history of asthma  Prior to arrival patient seen by bystanders sitting in car for several hours.  Was found to be unresponsive sitting up in car seat.  Per EMS patient agonal he breathing with pinpoint pupils.  1 mg Narcan given with improvement in breathing but no improvement in responsiveness.  Reports of decorticate posturing.  On arrival to the ED he was found to be GCS of 3.  He was intubated.  Shortly after intubation he developed in hospital cardiac arrest- PEA.  2 epi given, for 5 minutes of CPR total.  ABG with severe respiratory acidosis.  He later became hypoxic had another PEA arrest for about 2 minutes.  COOX panel within normal limits.  UDS positive for opioids, cocaine, benzos.  Lactate 5.5 > 5.9.   PCCM was consulted for admission.  Pertinent  Medical History  Asthma  Significant Hospital Events: Including procedures, antibiotic start and stop dates in addition to other pertinent events   10/29 presented to [facility] ED, PEA arrest 10/30 PCCM consult  Interim History / Subjective:  See  above  Levophed 79mg Fentanyl 1061m  Unable to obtain subjective evaluation due to patient status   Objective   Blood pressure 125/87, pulse (!) 104, temperature 100.3 F (37.9 C), resp. rate (!) 26, height 6' (1.829 m), weight 135 kg, SpO2 100 %.    Vent Mode: PRVC FiO2 (%):  [100 %] 100 % Set Rate:  [18 bmp-24 bmp] 24 bmp Vt Set:  [600 mL] 600 mL PEEP:  [5 cmH20-10 cmH20] 10 cmH20 Plateau Pressure:  [23 cmH20-30 cmH20] 30 cmH20   No intake or output data in the 24 hours ending 07/04/22 0318 Filed Weights   07/03/22 2200 07/03/22 2234  Weight: 135 kg 135 kg    Examination: General: In bed, NAD,  obese HEENT: MM pink/dry, anicteric, atraumatic Neuro: RASS -2, PERRL 15m20mopens eyes to stimuli, no purposeful movement, +Cough CV: S1S2, ST, no m/r/g appreciated PULM:  rhonchi  in the upper lobes, rhonchi  in the lower lobes, trachea midline, chest expansion symmetric GI: soft, bsx4 active, non-tender   Extremities: cool/dry, no pretibial edema, capillary refill less than 3 seconds  Skin:  no rashes or lesions noted  Labs/imaging Troponin 460 > 1022 Lactic acid 5.5 > 5.9 UDS positive for opioids, cocaine, benzodiazepines UA negative nitrite, trace leukocytes ABG 7.2 8/41/48/20 AST 103, ALT 92 COVID flu negative COOX 64,  panel within normal limits FOBT positive Blood cultures pending D-dimer 1 WBC 16.5 Potassium 5.6 > 4.1 Creatinine 1.41 (baseline 0.9-1)  CT head: Age-indeterminate lacunar infarct within left globus pallidus CT cervical spine: Negative for acute injury CTA chest abdomen pelvis:  1. No evidence of thoracoabdominal aortic aneurysm or dissection. 2. Complete right upper lobe collapse as well as subtotal collapse of the lower lobes bilaterally, right greater than left. Extensive airway impaction within the right upper lobe and right lower lobe. 3. Mild sigmoid diverticulosis without superimposed acute inflammatory change. Twelve-lead: Sinus rhythm, no ST changes noted   Resolved Hospital Problem  list     Assessment & Plan:  Cardiac arrest- PEA, suspect secondary to hypoxia from drug overdose/hypotension Shock, suspect cardiogenic versus septic Troponin elevation, suspect demand Lactic acidosis, secondary to cardiac arrest and shock Troponin 460 > 1022, Lactic acid 5.5 > 5.9.  CTA negative for PE.  Twelve-lead with no significant ST changes.  UDS positive for cocaine, opioids, benzos.   Questionable aspiration pneumonia in right upper lobe. WBC 16.5, tmax 100.5. Shock could also be related to overdose and/or postarrest hypotension. -Admit to ICU with continuous tele and SPO2 monitoring -1 L of LR given in the emergency department.  Additional liter ordered. -Goal MAP greater than 65.  Peripheral Levophed ordered. -Goal normothermia.Target temperature between 36 and 37.8 degrees celsius. Notify provider if temperature goals are not met. Place esophageal or bladder temperature probe. -Follow up CMP, MG, Phos, CBC, INR -Trend troponin and lactate -Obtain ECHO -Strict I&O -Given vancomycin/cefepime/Flagyl in the emergency department, switch to cefepime and vancomycin. Follow up blood cultures, urine culture.  Acute Metabolic Encephalopathy Secondary to cardiac arrest.  Patient GCS 3 on arrival.  Patient not following commands postarrest. Age-indeterminate lacunar infarct within left globus pallidus, seen on CT head -Spot  EEG. -Will consider MRI around 72 hours post arrest depending on clinical course -PAD bundle as discussed below. Goal RASS 0. Titrate medication to goal. -Frequent neuro exams -Continue neuroprotective measures: goal normothermia, euglycemia, HOB greater than 30 when able, head in neutral alignment, normocapnia, normoxia, normonatremia.    Acute respiratory failure with hypoxia HX asthma ?RUL aspiration pneumonia Suspect secondary to drug overdose and cardiac arrest. ABG 7.28/41/48/20.  Right upper lobe lung collapse seen on CT. Question aspiration -LTVV strategy with tidal volumes of 4-8 cc/kg ideal body weight -Goal plateau pressures less than 30 and driving pressures less than 15 -Wean PEEP/FiO2 for SpO2 92-98% -VAP bundle -Daily SAT and SBT -PAD bundle with Precedex gtt and fentanyl gtt -RASS goal 0 to -1 -Follow intermittent CXR and ABG PRN -Duonebs PRN -ABX as above  AKI Creatinine 1.41 (baseline 0.9-1). Kidneys WNL on CT -Ensure renal  perfusion. Goal MAP 65 or greater. -Avoid neprotoxic drugs as possible. -Strict I&O's -Follow up AM creatinine -Place foley  Hyperkalemia K 5.6>5.1 -monitor on BMP -Temporize as needed  Transaminitis  Suspect secondary to shock.  Liver and biliary tree within normal limits on CT. AST 103, ALT 92. -supportive care as above -Trend LFTs  FOBT positive HGB WNL -monitor   Best Practice (right click and "Reselect all SmartList Selections" daily)   Diet/type: NPO w/ meds via tube DVT prophylaxis: prophylactic heparin  GI prophylaxis: PPI Lines: N/A Foley:  Yes, and it is still needed Code Status:  full code Last date of multidisciplinary goals of care discussion [pending- called patient contact Fransico Meadow listed in chart at [phone]. Phone not in [place]: Recent Labs  Lab 07/03/22 2242 07/03/22 2250 07/03/22 2251 07/03/22 2343  WBC 16.5*  --   --   --   NEUTROABS 14.5*  --   --   --   HGB 14.4 15.0 15.3 14.6  HCT 43.7 44.0 45.0 43.0  MCV 79.5*  --   --   --   PLT 282  --   --   --     Basic Metabolic Panel: Recent Labs  Lab 07/03/22 2242 07/03/22 2250 07/03/22 2251 07/03/22 2343  NA 138 139 139 138  K 5.6* 5.5* 5.5* 4.1  CL 105  --  103  --  CO2 23  --   --   --   GLUCOSE 165*  --  159*  --   BUN 20  --  26*  --   CREATININE 1.47*  --  1.40*  --   CALCIUM 9.2  --   --   --   MG 2.2  --   --   --    GFR: Estimated Creatinine Clearance: 98.8 mL/min (A) (by C-G formula based on SCr of 1.4 mg/dL (H)). Recent Labs  Lab 07/03/22 2242 07/04/22 0102  WBC 16.5*  --   LATICACIDVEN 5.5* 5.9*    Liver Function Tests: Recent Labs  Lab 07/03/22 2242  AST 103*  ALT 92*  ALKPHOS 73  BILITOT 0.2*  PROT 7.0  ALBUMIN 3.6   No results for input(s): "LIPASE", "AMYLASE" in the last 168 hours. No results for input(s): "AMMONIA" in the last 168 hours.  ABG    Component Value Date/Time   PHART 7.285 (L) 07/03/2022 2343   PCO2ART 41.8  07/03/2022 2343   PO2ART 48 (L) 07/03/2022 2343   HCO3 20.0 07/03/2022 2343   TCO2 21 (L) 07/03/2022 2343   ACIDBASEDEF 7.0 (H) 07/03/2022 2343   O2SAT 80 07/03/2022 2343     Coagulation Profile: Recent Labs  Lab 07/03/22 2242  INR 1.0    Cardiac Enzymes: No results for input(s): "CKTOTAL", "CKMB", "CKMBINDEX", "TROPONINI" in the last 168 hours.  HbA1C: No results found for: "HGBA1C"  CBG: Recent Labs  Lab 07/04/22 0115  GLUCAP 108*    Review of Systems:   Unable to obtain ROS due to patient status  Past Medical History:  He,  has a past medical history of Asthma.   Surgical History:   Past Surgical History:  Procedure Laterality Date   FOOT SURGERY       Social History:   reports that he has never smoked. He has never used smokeless tobacco. He reports current alcohol use. He reports that he does not use drugs.   Family History:  His family history includes Healthy in his father.   Allergies Allergies  Allergen Reactions   Ibuprofen Swelling     Home Medications  Prior to Admission medications   Medication Sig Start Date End Date Taking? Authorizing Provider  albuterol (VENTOLIN HFA) 108 (90 Base) MCG/ACT inhaler Inhale 1-2 puffs into the lungs every 6 (six) hours as needed for wheezing or shortness of breath. 09/02/21   Francene Finders, PA-C  cetirizine (ZYRTEC ALLERGY) 10 MG tablet Take 1 tablet (10 mg total) by mouth daily. Patient not taking: Reported on 08/30/2021 03/20/20   Hall-Potvin, [place], PA-C  doxycycline (VIBRAMYCIN) 100 MG capsule Take 1 capsule (100 mg total) by mouth 2 (two) times daily. 11/17/21   Teodora Medici, FNP  ondansetron (ZOFRAN-ODT) 4 MG disintegrating tablet Take 1 tablet (4 mg total) by mouth every 8 (eight) hours as needed for nausea or vomiting. 09/02/21   Francene Finders, PA-C     Critical care time: 45 minutes    The patient is critically ill with multiple organ systems failure and requires high complexity decision  making for assessment and support, frequent evaluation and titration of therapies, application of advanced monitoring technologies and extensive interpretation of multiple databases.    Critical Care Time devoted to patient care services described in this note is 45 minutes. This time reflects time of care of this [place] NP. This critical care time does not reflect procedure time but could involve  care discussion time with the PCCM attending.  Redmond School., MSN, APRN, AGACNP-BC [facility]  07/04/2022 , 3:18 AM  Please see [email or web address] for pager details  If no response, please call [phone] After hours, please call Elink at [phone]

## 2022-07-04 NOTE — ED Provider Notes (Signed)
  Physical Exam  BP (!) 157/77   Pulse 83   Temp 100.3 F (37.9 C)   Resp 17   Ht 6' (1.829 m)   Wt 135 kg   SpO2 (!) 86%   BMI 40.36 kg/m   Physical Exam  Procedures  .Critical Care  Performed by: Merrily Pew, MD Authorized by: Merrily Pew, MD   Critical care provider statement:    Critical care time (minutes):  30   Critical care was necessary to treat or prevent imminent or life-threatening deterioration of the following conditions:  Respiratory failure and cardiac failure   Critical care was time spent personally by me on the following activities:  Development of treatment plan with patient or surrogate, discussions with consultants, evaluation of patient's response to treatment, examination of patient, ordering and review of laboratory studies, ordering and review of radiographic studies, ordering and performing treatments and interventions, pulse oximetry, re-evaluation of patient's condition and review of old charts CPR  Date/Time: 07/15/2022 12:00 AM  Performed by: Merrily Pew, MD Authorized by: Merrily Pew, MD  CPR Procedure Details:      Amount of time prior to administration of ACLS/BLS (minutes):  1   CPR/ACLS performed in the ED: Yes     Duration of CPR (minutes):  5   Outcome: ROSC obtained    CPR performed via ACLS guidelines under my direct supervision.  See RN documentation for details including defibrillator use, medications, doses and timing.   ED Course / MDM    Medical Decision Making Amount and/or Complexity of Data Reviewed Labs: ordered. Radiology: ordered. ECG/medicine tests: ordered.  Risk OTC drugs. Prescription drug management. Decision regarding hospitalization.   Assumed care after presumed respiratory arrest, intubation. Pending labs and imaging for admission. Patient had another hypotensive/bradycardic PEA arrest. He was bagged and had significant secretions. CPR was performed, epi given. After suctioning and high quality CPR  patient obtained ROSC. He remained stable on the ventilator/levophed at time of admission to ICU.        Myelle Poteat, Corene Cornea, MD 07/15/22 0001

## 2022-07-04 NOTE — Procedures (Signed)
Central Venous Catheter Insertion Procedure Note  Anes Rigel  [id number]  03-17-1981  Date:07/04/22  Time:5:32 AM   Provider Performing:Lashunta Frieden Cleon Dew and Dr. Marchelle Gearing  Procedure: Insertion of Non-tunneled Central Venous Catheter(36556) with US guidance (83151)   Indication(s) Medication administration  Consent Unable to obtain consent due to emergent nature of procedure.  Anesthesia Topical only with 1% lidocaine   Timeout Verified patient identification, verified procedure, site/side was marked, verified correct patient position, special equipment/implants available, medications/allergies/relevant history reviewed, required imaging and test results available.  Sterile Technique Maximal sterile technique including full sterile barrier drape, hand hygiene, sterile gown, sterile gloves, mask, hair covering, sterile ultrasound probe cover (if used).  Procedure Description Area of catheter insertion was cleaned with chlorhexidine and draped in sterile fashion. Easy access of right internal jugular with one stick. Unable to dilate over wire due to questionable scar tissue. Dr. Patsey Berthold called to room. Unable to dilate. Wire removed. Dr. Patsey Berthold restuck patient with access with one attempt, then with  real-time ultrasound guidance a central venous catheter was placed into the right internal jugular vein. Nonpulsatile blood flow and easy flushing noted in all ports.  The catheter was sutured in place and sterile dressing applied.  Complications/Tolerance None; patient tolerated the procedure well. Chest X-ray is ordered to verify placement for internal jugular or subclavian cannulation.   Chest x-ray is not ordered for femoral cannulation.  EBL Minimal  Specimen(s) None  Redmond School., MSN, APRN, AGACNP-BC [facility]  07/04/2022 , 5:35 AM  Please see [email or web address] for pager details  If no response, please call [phone] After  hours, please call Elink at [phone]

## 2022-07-04 NOTE — Sepsis Progress Note (Signed)
Elink following for Sepsis Protocol 

## 2022-07-04 NOTE — Progress Notes (Signed)
Consult for USGPIV for vasopressor. No veins in either forearm large enough or shallow enough for PIV for vasopressor protocol. Primary RN notified.

## 2022-07-05 ENCOUNTER — Inpatient Hospital Stay (HOSPITAL_COMMUNITY): Payer: Self-pay

## 2022-07-05 DIAGNOSIS — N179 Acute kidney failure, unspecified: Secondary | ICD-10-CM

## 2022-07-05 LAB — CULTURE, BLOOD (ROUTINE X 2): Special Requests: ADEQUATE

## 2022-07-05 LAB — CBC
HCT: 36.9 % — ABNORMAL LOW (ref 39.0–52.0)
Hemoglobin: 13.4 g/dL (ref 13.0–17.0)
MCH: 26.6 pg (ref 26.0–34.0)
MCHC: 36.3 g/dL — ABNORMAL HIGH (ref 30.0–36.0)
MCV: 73.4 fL — ABNORMAL LOW (ref 80.0–100.0)
Platelets: 259 10*3/uL (ref 150–400)
RBC: 5.03 MIL/uL (ref 4.22–5.81)
RDW: 14.1 % (ref 11.5–15.5)
WBC: 12.2 10*3/uL — ABNORMAL HIGH (ref 4.0–10.5)
nRBC: 0 % (ref 0.0–0.2)

## 2022-07-05 LAB — URINE CULTURE: Culture: NO GROWTH

## 2022-07-05 LAB — BASIC METABOLIC PANEL
Anion gap: 9 (ref 5–15)
BUN: 9 mg/dL (ref 6–20)
CO2: 23 mmol/L (ref 22–32)
Calcium: 8.9 mg/dL (ref 8.9–10.3)
Chloride: 105 mmol/L (ref 98–111)
Creatinine, Ser: 1.1 mg/dL (ref 0.61–1.24)
GFR, Estimated: >60 mL/min (ref >60)
Glucose, Bld: 154 mg/dL — ABNORMAL HIGH (ref 70–99)
Potassium: 3.2 mmol/L — ABNORMAL LOW (ref 3.5–5.1)
Sodium: 137 mmol/L (ref 135–145)

## 2022-07-05 LAB — MAGNESIUM: Magnesium: 1.5 mg/dL — ABNORMAL LOW (ref 1.7–2.4)

## 2022-07-05 LAB — HEPATIC FUNCTION PANEL
ALT: 75 U/L — ABNORMAL HIGH (ref 0–44)
AST: 58 U/L — ABNORMAL HIGH (ref 15–41)
Albumin: 2.9 g/dL — ABNORMAL LOW (ref 3.5–5.0)
Alkaline Phosphatase: 58 U/L (ref 38–126)
Bilirubin, Direct: 0.3 mg/dL — ABNORMAL HIGH (ref 0.0–0.2)
Indirect Bilirubin: 0.8 mg/dL (ref 0.3–0.9)
Total Bilirubin: 1.1 mg/dL (ref 0.3–1.2)
Total Protein: 6.2 g/dL — ABNORMAL LOW (ref 6.5–8.1)

## 2022-07-05 LAB — PHOSPHORUS
Phosphorus: <1 mg/dL — CL (ref 2.5–4.6)
Phosphorus: 3.5 mg/dL (ref 2.5–4.6)

## 2022-07-05 LAB — GLUCOSE, CAPILLARY
Glucose-Capillary: 130 mg/dL — ABNORMAL HIGH (ref 70–99)
Glucose-Capillary: 122 mg/dL — ABNORMAL HIGH (ref 70–99)
Glucose-Capillary: 144 mg/dL — ABNORMAL HIGH (ref 70–99)

## 2022-07-05 MED ORDER — DOCUSATE SODIUM 50 MG/5ML PO LIQD
100.0000 mg | Freq: Two times a day (BID) | ORAL | Status: DC
  Administered 2022-07-06 – 2022-07-07 (×3): 100 mg via ORAL
  Filled 2022-07-05 (×7): qty 10

## 2022-07-05 MED ORDER — POTASSIUM PHOSPHATES 15 MMOLE/5ML IV SOLN
45.0000 mmol | Freq: Once | INTRAVENOUS | Status: AC
  Administered 2022-07-05: 45 mmol via INTRAVENOUS
  Filled 2022-07-05: qty 15

## 2022-07-05 MED ORDER — ORAL CARE MOUTH RINSE: 15.0000 mL | OROMUCOSAL | Status: DC | PRN

## 2022-07-05 MED ORDER — FAMOTIDINE 20 MG PO TABS
20.0000 mg | ORAL_TABLET | Freq: Every day | ORAL | Status: DC
  Administered 2022-07-05 – 2022-07-09 (×5): 20 mg
  Filled 2022-07-05 (×5): qty 1

## 2022-07-05 MED ORDER — MAGNESIUM SULFATE 4 GM/100ML IV SOLN
4.0000 g | Freq: Once | INTRAVENOUS | Status: AC
  Administered 2022-07-05: 4 g via INTRAVENOUS
  Filled 2022-07-05: qty 100

## 2022-07-05 MED ORDER — POTASSIUM CHLORIDE 20 MEQ PO PACK
40.0000 meq | PACK | Freq: Once | ORAL | Status: DC
  Filled 2022-07-05: qty 2

## 2022-07-05 MED ORDER — POTASSIUM CHLORIDE CRYS ER 20 MEQ PO TBCR
40.0000 meq | EXTENDED_RELEASE_TABLET | Freq: Once | ORAL | Status: AC
  Administered 2022-07-05: 40 meq via ORAL
  Filled 2022-07-05: qty 2

## 2022-07-05 MED ORDER — SALINE SPRAY 0.65 % NA SOLN
1.0000 | NASAL | Status: DC | PRN
  Administered 2022-07-05 (×2): 1 via NASAL
  Filled 2022-07-05: qty 44

## 2022-07-05 MED ORDER — ORAL CARE MOUTH RINSE
15.0000 mL | OROMUCOSAL | Status: DC
  Administered 2022-07-05 – 2022-07-08 (×15): 15 mL via OROMUCOSAL

## 2022-07-05 MED ORDER — ACETAMINOPHEN 325 MG PO TABS
650.0000 mg | ORAL_TABLET | Freq: Four times a day (QID) | ORAL | Status: DC | PRN
  Administered 2022-07-05 – 2022-07-06 (×2): 650 mg via ORAL
  Filled 2022-07-05 (×2): qty 2

## 2022-07-05 NOTE — Progress Notes (Signed)
Critical phosphorous value <1.0, RN informed

## 2022-07-05 NOTE — Procedures (Signed)
Extubation Procedure Note  Patient Details:   Name: Jeffery Blackwell DOB: 12-10-1980 MRN: [id number]   Airway Documentation:    Vent end date: 07/05/22 Vent end time: 0905   Evaluation  O2 sats: stable throughout Complications: No apparent complications Patient did tolerate procedure well. Bilateral Breath Sounds: Diminished   Yes Pt was successfully extubated with no apparent complications. Audible cuffleak was heard prior extubation and no signs of stridor at this time. Pt is able to speak and state name. Pt is currently on 4L [place] and is currently stable at this time.  Felecia Jan 07/05/2022, 9:11 AM

## 2022-07-05 NOTE — Progress Notes (Signed)
[facility] ADULT ICU REPLACEMENT PROTOCOL   The patient does apply for the [facility] Adult ICU Electrolyte Replacment Protocol based on the criteria listed below:   1.Exclusion criteria: TCTS patients, ECMO patients, and Dialysis patients 2. Is GFR >/= 30 ml/min? Yes.    Patient's GFR today is >60 3. Is SCr </= 2? Yes.   Patient's SCr is 1.10 mg/dL 4. Did SCr increase >/= 0.5 in 24 hours? No. 5.Pt's weight >40kg  Yes.   6. Abnormal electrolyte(s): k 3.4 phos 1.0 mag 1.5  7. Electrolytes replaced per protocol 8.  Call MD STAT for K+ </= 2.5, Phos </= 1, or Mag </= 1 Physician:  Dr Bonne Dolores, Raveen Wieseler A 07/05/2022 5:29 AM

## 2022-07-05 NOTE — Evaluation (Addendum)
Occupational Therapy Evaluation Patient Details Name: Jeffery Blackwell MRN: [id number] DOB: 05/11/1981 Today's Date: 07/05/2022   History of Present Illness Pt is a 41 y.o. male admitted 07/03/22 after found unresponsive in car, PEA cardiac arrest, drug OD, reports of decorticate posturing; UDS (+) opioids, cocaine, benzos. ETT 10/29-10/31. S/p bronchoscopy 10/30. EEG 10/30 suggestive of moderate diffuse encephalopathy; no seizures. MRI 10/31 Patchy restricted diffusion involving L > R globus palladi; nonspecific findings, differential considerations of CO poisoning, hypoxic ischemic encephalopathy, or changes related to drug abuse. PMH includes polysubstance use, asthma.   Clinical Impression   PTA patient independent and working. Admitted for above and presents with problem list below, including impaired cognition, decreased activity tolerance, generalized weakness, impaired balance, and decreased L UE (dominant) coordination.  Jeffery Blackwell completes bed mobility with min assist, transfers with min assist +2 safety, and Adls with setup to max assist.  Limited mobility to recliner as pt became nauseated. Jeffery Blackwell is oriented and follows simple commands, but demonstrates slow processing, decreased problem solving and decreased awareness to deficits.  Based on performance today, believe Jeffery Blackwell will best benefit from further OT services acutely and after dc at outpatient OT level (pending progress) to optimize independence and return to PLOF.    BP 114/76, HR 80s  SpO2 on 4L 99%, decreased to RA maintained 96%   Recommendations for follow up therapy are one component of a multi-disciplinary discharge planning process, led by the attending physician.  Recommendations may be updated based on patient status, additional functional criteria and insurance authorization.   Follow Up Recommendations  Outpatient OT (pending progress)    Assistance Recommended at Discharge Frequent or constant Supervision/Assistance  Patient can  return home with the following A little help with walking and/or transfers;A lot of help with bathing/dressing/bathroom;Assistance with cooking/housework;Direct supervision/assist for medications management;Direct supervision/assist for financial management;Assist for transportation;Help with stairs or ramp for entrance    Functional Status Assessment  Patient has had a recent decline in their functional status and demonstrates the ability to make significant improvements in function in a reasonable and predictable amount of time.  Equipment Recommendations  Other (comment) (pending progress)    Recommendations for Other Services       Precautions / Restrictions Precautions Precautions: Fall Restrictions Weight Bearing Restrictions: No      Mobility Bed Mobility Overal bed mobility: Needs Assistance Bed Mobility: Supine to Sit     Supine to sit: Min assist, HOB elevated     General bed mobility comments: assist for trunk support to ascend and scoot forward, discomfort due to foley    Transfers Overall transfer level: Needs assistance Equipment used: 2 person hand held assist Transfers: Sit to/from Stand Sit to Stand: Min assist, +2 safety/equipment                  Balance Overall balance assessment: Needs assistance Sitting-balance support: No upper extremity supported, Feet supported Sitting balance-Leahy Scale: Fair Sitting balance - Comments: statically supervision, limited dynamically   Standing balance support: Bilateral upper extremity supported, During functional activity, Single extremity supported Standing balance-Leahy Scale: Poor Standing balance comment: relies on UE support                           ADL either performed or assessed with clinical judgement   ADL Overall ADL's : Needs assistance/impaired     Grooming: Minimal assistance;Sitting           Upper Body Dressing : Moderate  assistance;Sitting   Lower Body Dressing:  Maximal assistance;Sit to/from stand;+2 for safety/equipment Lower Body Dressing Details (indicate cue type and reason): assist with socks, min assist sit to stand Toilet Transfer: Minimal assistance Toilet Transfer Details (indicate cue type and reason): stand pivot to recliner         Functional mobility during ADLs: Minimal assistance;Cueing for safety;+2 for safety/equipment;Cueing for sequencing       Vision Baseline Vision/History: 0 No visual deficits Ability to See in Adequate Light: 0 Adequate Patient Visual Report: No change from baseline Vision Assessment?: No apparent visual deficits Additional Comments: able to read clock without difficulty, continue assessment as needed     Perception     Praxis      Pertinent Vitals/Pain Pain Assessment Pain Assessment: Faces Faces Pain Scale: Hurts a little bit Pain Location: urinary catheter Pain Descriptors / Indicators: Discomfort Pain Intervention(s): Limited activity within patient's tolerance, Monitored during session, Repositioned     Hand Dominance Left   Extremity/Trunk Assessment Upper Extremity Assessment Upper Extremity Assessment: LUE deficits/detail;Generalized weakness LUE Deficits / Details: mild edema, decreased coordination; drops wash cloth multiple times LUE Sensation: WNL LUE Coordination: decreased fine motor;decreased gross motor   Lower Extremity Assessment Lower Extremity Assessment: Defer to PT evaluation       Communication Communication Communication: No difficulties (soft spoken)   Cognition Arousal/Alertness: Lethargic Behavior During Therapy: Flat affect Overall Cognitive Status: Impaired/Different from baseline Area of Impairment: Problem solving, Awareness, Following commands, Attention                   Current Attention Level: Selective   Following Commands: Follows one step commands consistently, Follows one step commands with increased time   Awareness:  Emergent Problem Solving: Slow processing, Requires verbal cues, Difficulty sequencing General Comments: pt oriented, able to recall what Jeffery Blackwell has been told about situation but does not remember what happened.  Jeffery Blackwell follows simple commands with increased time but noted decreased awarenes of deficts.     General Comments  VSS on 4L upon entry, able to decreased to RA with SPo2 maintained at 96%    Exercises     Shoulder Instructions      Home Living Family/patient expects to be discharged to:: Private residence Living Arrangements: Children;Other (Comment) (kids mom) Available Help at Discharge: Family;Available PRN/intermittently Type of Home: Apartment Home Access: Level entry     Home Layout: One level     Bathroom Shower/Tub: Teacher, early years/pre: Standard     Home Equipment: None   Additional Comments: 2 kids- 3 and 4; reports kids mom can help as needed      Prior Functioning/Environment Prior Level of Function : Independent/Modified Independent;Driving;Working/employed               ADLs Comments: works as a Development worker, community "builds highways", driving        OT Problem List: Decreased strength;Decreased activity tolerance;Impaired balance (sitting and/or standing);Increased edema;Impaired UE functional use;Obesity;Decreased knowledge of precautions;Decreased knowledge of use of DME or AE;Decreased safety awareness;Decreased cognition;Decreased coordination      OT Treatment/Interventions: Self-care/ADL training;DME and/or AE instruction;Therapeutic activities;Balance training;Patient/family education;Cognitive remediation/compensation;Therapeutic exercise;Energy conservation    OT Goals(Current goals can be found in the care plan section) Acute Rehab OT Goals Patient Stated Goal: feel better OT Goal Formulation: With patient Time For Goal Achievement: 07/19/22 Potential to Achieve Goals: Good  OT Frequency: Min 2X/week     Co-evaluation PT/OT/SLP Co-Evaluation/Treatment: Yes Reason for Co-Treatment: For patient/therapist  safety;To address functional/ADL transfers;Complexity of the patient's impairments (multi-system involvement)   OT goals addressed during session: ADL's and self-care      AM-PAC OT "6 Clicks" Daily Activity     Outcome Measure Help from another person eating meals?: A Little Help from another person taking care of personal grooming?: A Little Help from another person toileting, which includes using toliet, bedpan, or urinal?: A Lot Help from another person bathing (including washing, rinsing, drying)?: A Lot Help from another person to put on and taking off regular upper body clothing?: A Little Help from another person to put on and taking off regular lower body clothing?: A Lot 6 Click Score: 15   End of Session Equipment Utilized During Treatment: Gait belt;Oxygen Nurse Communication: Mobility status  Activity Tolerance: Patient tolerated treatment well Patient left: in chair;with call bell/phone within reach;with chair alarm set  OT Visit Diagnosis: Other abnormalities of gait and mobility (R26.89);Muscle weakness (generalized) (M62.81);Other symptoms and signs involving cognitive function                Time: 2229-7989 OT Time Calculation (min): 30 min Charges:  OT General Charges $OT Visit: 1 Visit OT Evaluation $OT Eval Moderate Complexity: 1 Mod  Jolaine Artist, OT Acute Rehabilitation Services Office [phone]   Delight Stare 07/05/2022, 1:52 PM

## 2022-07-05 NOTE — Progress Notes (Signed)
NAME:  Vergil Burby, MRN:  [id number], DOB:  1981/03/05, LOS: 1 ADMISSION DATE:  07/03/2022, CONSULTATION DATE:  10/30 REFERRING MD:  Mesner, CHIEF COMPLAINT:  unresponsive   History of Present Illness:  Laird Runnion, is a 41 y.o. male, who presented to the [facility] ED by EMS after being found unresponsive in the car.  They have a pertinent past medical history of asthma  Prior to arrival patient seen by bystanders sitting in car for several hours.  Was found to be unresponsive sitting up in car seat.  Per EMS patient agonal he breathing with pinpoint pupils.  1 mg Narcan given with improvement in breathing but no improvement in responsiveness.  Reports of decorticate posturing.  On arrival to the ED he was found to be GCS of 3.  He was intubated.  Shortly after intubation he developed in hospital cardiac arrest- PEA.  2 epi given, for 5 minutes of CPR total.  ABG with severe respiratory acidosis.  He later became hypoxic had another PEA arrest for about 2 minutes.  COOX panel within normal limits.  UDS positive for opioids, cocaine, benzos.  Lactate 5.5 > 5.9.   PCCM was consulted for admission.  Pertinent  Medical History  Asthma  Significant Hospital Events: Including procedures, antibiotic start and stop dates in addition to other pertinent events   10/29 presented to [facility] ED, PEA arrest 10/30 PCCM consult  Interim History / Subjective:  Patient is following commands, tolerating spontaneous breathing trial We will try to extubate him  Objective   Blood pressure 101/60, pulse 79, temperature (!) 100.4 F (38 C), resp. rate (!) 21, height 6' (1.829 m), weight 135.9 kg, SpO2 97 %.    Vent Mode: PSV;CPAP FiO2 (%):  [40 %-50 %] 40 % Set Rate:  [20 bmp-24 bmp] 20 bmp Vt Set:  [600 mL-620 mL] 620 mL PEEP:  [5 cmH20-8 cmH20] 5 cmH20 Pressure Support:  [8 cmH20] 8 cmH20 Plateau Pressure:  [17 cmH20-24 cmH20] 17 cmH20   Intake/Output Summary (Last 24 hours) at 07/05/2022 1203 Last  data filed at 07/05/2022 1100 Gross per 24 hour  Intake 2912.29 ml  Output 2695 ml  Net 217.29 ml   Filed Weights   07/03/22 2234 07/04/22 0600 07/05/22 0500  Weight: 135 kg 134.6 kg 135.9 kg    Examination: Physical exam: General: Crtitically ill-appearing morbidly male, orally intubated HEENT: [place]/AT, eyes anicteric.  ETT and OGT in place Neuro: Opens eyes with vocal stimuli, following simple commands, moving all 4 extremities Chest: Coarse breath sounds, no wheezes or rhonchi Heart: Regular rate and rhythm, no murmurs or gallops Abdomen: Soft, nontender, nondistended, bowel sounds present Skin: No rash   Resolved Hospital Problem list     Assessment & Plan:  S/p PEA cardiac arrest due to hypoxia in the setting of drug overdose septic shock due to aspiration pneumonia Demand cardiac ischemia Lactic acidosis, secondary to cardiac arrest and shock Polysubstance abuse Continue supportive care Patient is awake, following commands Continue IV antibiotics Continue vasopressor support with map goal 65 Lactate is trending down Watch for signs of withdrawal Echocardiogram is pending  Acute toxic/septic encephalopathy In the setting of drug abuse and aspiration pneumonia Mental status is improving Avoid deep sedation Stop Precedex   Left basal ganglia stroke  Initial CT scan showed left basal ganglia hypodensity consistent with acute stroke MRI brain showed changes likely due to toxic exposure not due to ischemic stroke Continue neuro watch Spot EEG was negative   Acute hypoxic/hypercapnic  respiratory failure Aspiration pneumonia Hypercapnia has. Patient is tolerating spontaneous breathing trial We will try to extubate him Continue IV Unasyn Follow-up respiratory culture  VAP bundle in place   AKI due to septic ATN Hypokalemia/hypomagnesemia/hypophosphatemia Creatinine 1.41 (baseline 0.9-1) Serum creatinine is trending down Continue aggressive electrolyte  supplement and monitor  Hyperkalemia, resolved  Shock liver in the setting of cardiac arrest LFTs are improving  Morbid obesity Dietitian follow-up Counseling provided Diet and exercise   Best Practice (right click and "Reselect all SmartList Selections" daily)   Diet/type: Speech and swallow evaluation postextubation DVT prophylaxis: prophylactic heparin  GI prophylaxis: PPI Lines: N/A Foley:  Yes, and it is still needed Code Status:  full code Last date of multidisciplinary goals of care discussion [pending  Labs   CBC: Recent Labs  Lab 07/03/22 2242 07/03/22 2250 07/03/22 2251 07/03/22 2343 07/04/22 0555 07/05/22 0436  WBC 16.5*  --   --   --  12.9* 12.2*  NEUTROABS 14.5*  --   --   --   --   --   HGB 14.4 15.0 15.3 14.6 13.5 13.4  HCT 43.7 44.0 45.0 43.0 38.9* 36.9*  MCV 79.5*  --   --   --  75.0* 73.4*  PLT 282  --   --   --  291 259    Basic Metabolic Panel: Recent Labs  Lab 07/03/22 2242 07/03/22 2250 07/03/22 2251 07/03/22 2343 07/04/22 0555 07/05/22 0436  NA 138 139 139 138  --  137  K 5.6* 5.5* 5.5* 4.1  --  3.2*  CL 105  --  103  --   --  105  CO2 23  --   --   --   --  23  GLUCOSE 165*  --  159*  --   --  154*  BUN 20  --  26*  --   --  9  CREATININE 1.47*  --  1.40*  --  1.16 1.10  CALCIUM 9.2  --   --   --   --  8.9  MG 2.2  --   --   --   --  1.5*  PHOS  --   --   --   --   --  <1.0*   GFR: Estimated Creatinine Clearance: 126.1 mL/min (by C-G formula based on SCr of 1.1 mg/dL). Recent Labs  Lab 07/03/22 2242 07/04/22 0102 07/04/22 0555 07/04/22 0734 07/05/22 0436  WBC 16.5*  --  12.9*  --  12.2*  LATICACIDVEN 5.5* 5.9*  --  2.1*  --     Liver Function Tests: Recent Labs  Lab 07/03/22 2242 07/05/22 0436  AST 103* 58*  ALT 92* 75*  ALKPHOS 73 58  BILITOT 0.2* 1.1  PROT 7.0 6.2*  ALBUMIN 3.6 2.9*   No results for input(s): "LIPASE", "AMYLASE" in the last 168 hours. No results for input(s): "AMMONIA" in the last 168  hours.  ABG    Component Value Date/Time   PHART 7.285 (L) 07/03/2022 2343   PCO2ART 41.8 07/03/2022 2343   PO2ART 48 (L) 07/03/2022 2343   HCO3 20.0 07/03/2022 2343   TCO2 21 (L) 07/03/2022 2343   ACIDBASEDEF 7.0 (H) 07/03/2022 2343   O2SAT 80 07/03/2022 2343     Coagulation Profile: Recent Labs  Lab 07/03/22 2242  INR 1.0    Cardiac Enzymes: No results for input(s): "CKTOTAL", "CKMB", "CKMBINDEX", "TROPONINI" in the last 168 hours.  HbA1C: No results found for: "HGBA1C"  CBG:  Recent Labs  Lab 07/04/22 0115 07/04/22 0737 07/04/22 1151 07/05/22 0749 07/05/22 1125  GLUCAP 108* 121* 167* 130* 122*    Total critical care time: 41 minutes  Performed by: [place] care time was exclusive of separately billable procedures and treating other patients.   Critical care was necessary to treat or prevent imminent or life-threatening deterioration.   Critical care was time spent personally by me on the following activities: development of treatment plan with patient and/or surrogate as well as nursing, discussions with consultants, evaluation of patient's response to treatment, examination of patient, obtaining history from patient or surrogate, ordering and performing treatments and interventions, ordering and review of laboratory studies, ordering and review of radiographic studies, pulse oximetry and re-evaluation of patient's condition.   Jacky Kindle, MD [place] [facility] See Amion for pager If no response to pager, please call [phone] until 7pm After 7pm, Please call E-link [phone]

## 2022-07-05 NOTE — Progress Notes (Signed)
Transported patient to MRI while patient was on the vent. Patient remained stable during transport.

## 2022-07-05 NOTE — Evaluation (Signed)
Physical Therapy Evaluation Patient Details Name: Jeffery Blackwell MRN: [id number] DOB: 07-02-1981 Today's Date: 07/05/2022  History of Present Illness  Pt is a 41 y.o. male admitted 07/03/22 after found unresponsive in car, PEA cardiac arrest, drug OD, reports of decorticate posturing; UDS (+) opioids, cocaine, benzos. ETT 10/29-10/31. S/p bronchoscopy 10/30. EEG 10/30 suggestive of moderate diffuse encephalopathy; no seizures. MRI 10/31 Patchy restricted diffusion involving L > R globus palladi; nonspecific findings, differential considerations of CO poisoning, hypoxic ischemic encephalopathy, or changes related to drug abuse. PMH includes polysubstance use, asthma.   Clinical Impression  Pt presents with an overall decrease in functional mobility secondary to above. PTA, pt independent, works, drives, has two kids. Today, pt tolerated OOB mobility with minA; session limited by c/o nausea, pt becoming lethargic after brief mobility. Pt pleasant, oriented and following commands, but limited by apparent slowed processing, poor problem solving and decreased awareness. Pt would benefit from continued acute PT services to maximize functional mobility and independence prior to d/c with outpatient therapy services pending improvements in cognition.      SpO2 >/94% on RA RA 80s Post-mobility BP 114/76 (87)   Recommendations for follow up therapy are one component of a multi-disciplinary discharge planning process, led by the attending physician.  Recommendations may be updated based on patient status, additional functional criteria and insurance authorization.  Follow Up Recommendations Outpatient PT      Assistance Recommended at Discharge Intermittent Supervision/Assistance  Patient can return home with the following  A little help with bathing/dressing/bathroom;Assistance with cooking/housework;Assist for transportation;A little help with walking and/or transfers;Direct supervision/assist for  medications management;Direct supervision/assist for financial management    Equipment Recommendations None recommended by PT  Recommendations for Other Services       Functional Status Assessment Patient has had a recent decline in their functional status and demonstrates the ability to make significant improvements in function in a reasonable and predictable amount of time.     Precautions / Restrictions Precautions Precautions: Fall Restrictions Weight Bearing Restrictions: No      Mobility  Bed Mobility Overal bed mobility: Needs Assistance Bed Mobility: Supine to Sit     Supine to sit: Min assist, HOB elevated     General bed mobility comments: good ability to initiate movement, minA for HHA to elevate trunk, limited by c/o pain at foley site    Transfers Overall transfer level: Needs assistance Equipment used: 1 person hand held assist Transfers: Sit to/from Stand, Bed to chair/wheelchair/BSC Sit to Stand: Min assist   Step pivot transfers: Min assist, +2 safety/equipment       General transfer comment: 2x sit<>stand from EOB with minA for HHA to stabilize; steps at EOB then pivotal steps to recliner with minA for HHA    Ambulation/Gait               General Gait Details: deferred secondary to c/o nausea  Stairs            Wheelchair Mobility    Modified Rankin (Stroke Patients Only)       Balance Overall balance assessment: Needs assistance Sitting-balance support: No upper extremity supported, Feet supported Sitting balance-Leahy Scale: Fair     Standing balance support: Bilateral upper extremity supported, During functional activity, Single extremity supported Standing balance-Leahy Scale: Poor Standing balance comment: reliant on HHA to stabilize this session  Pertinent Vitals/Pain Pain Assessment Pain Assessment: Faces Faces Pain Scale: Hurts little more Pain Location: urinary catheter  insertion site Pain Descriptors / Indicators: Discomfort, Grimacing, Guarding Pain Intervention(s): Monitored during session, Repositioned, Limited activity within patient's tolerance    Home Living Family/patient expects to be discharged to:: Private residence Living Arrangements: Children;Other (Comment) (kids' mom sometimes) Available Help at Discharge: Family;Available PRN/intermittently Type of Home: Apartment Home Access: Level entry       Home Layout: One level Home Equipment: None Additional Comments: has two kids (3 and 4 y.o.); reports kids' mom can help as needed    Prior Function Prior Level of Function : Independent/Modified Independent;Driving;Working/employed             Mobility Comments: works as a Psychologist, forensic "builds highways", driving ADLs Comments: independent     Higher education careers adviser   Dominant Hand: Left    Extremity/Trunk Assessment   Upper Extremity Assessment Upper Extremity Assessment: LUE deficits/detail LUE Deficits / Details: mild edema, decreased coordination; drops wash cloth multiple times LUE Sensation: WNL LUE Coordination: decreased fine motor;decreased gross motor    Lower Extremity Assessment Lower Extremity Assessment: Overall WFL for tasks assessed (>3/5 functional strength observed; not formally tested)       Communication   Communication: No difficulties (soft spoken)  Cognition Arousal/Alertness: Awake/alert, Lethargic Behavior During Therapy: Flat affect Overall Cognitive Status: Impaired/Different from baseline Area of Impairment: Attention, Following commands, Safety/judgement, Awareness, Problem solving                   Current Attention Level: Selective   Following Commands: Follows one step commands consistently, Follows one step commands with increased time Safety/Judgement: Decreased awareness of deficits Awareness: Emergent Problem Solving: Slow processing, Requires verbal cues, Difficulty  sequencing General Comments: pt oriented, able to recall what he has been told about situation but does not remember what happened.  He follows simple commands with increased time but noted decreased awarenes of deficts. easily distracted by pain at catheter site requiring frequent redirection. slowed responses; increased lethargy by end of session starting to fall asleep in recliner        General Comments General comments (skin integrity, edema, etc.): SpO2 99% on 4L O2 [place]; maintaining >/94% on RA at end of session. HR 80s, post-standing BP 114/76 (87)    Exercises     Assessment/Plan    PT Assessment Patient needs continued PT services  PT Problem List Decreased strength;Decreased activity tolerance;Decreased balance;Decreased mobility;Decreased cognition;Cardiopulmonary status limiting activity       PT Treatment Interventions DME instruction;Gait training;Stair training;Functional mobility training;Therapeutic activities;Therapeutic exercise;Patient/family education;Cognitive remediation;Balance training    PT Goals (Current goals can be found in the Care Plan section)  Acute Rehab PT Goals Patient Stated Goal: get some rest PT Goal Formulation: With patient Time For Goal Achievement: 07/19/22 Potential to Achieve Goals: Good    Frequency Min 3X/week     Co-evaluation   Reason for Co-Treatment: For patient/therapist safety;To address functional/ADL transfers;Complexity of the patient's impairments (multi-system involvement)   OT goals addressed during session: ADL's and self-care       AM-PAC PT "6 Clicks" Mobility  Outcome Measure Help needed turning from your back to your side while in a flat bed without using bedrails?: A Little Help needed moving from lying on your back to sitting on the side of a flat bed without using bedrails?: A Little Help needed moving to and from a bed to a chair (including a wheelchair)?: A Little  Help needed standing up from a chair using  your arms (e.g., wheelchair or bedside chair)?: A Little Help needed to walk in hospital room?: A Little Help needed climbing 3-5 steps with a railing? : A Little 6 Click Score: 18    End of Session Equipment Utilized During Treatment: Gait belt Activity Tolerance: Patient tolerated treatment well;Other (comment);Patient limited by lethargy (limited by nauea) Patient left: in chair;with call bell/phone within reach Nurse Communication: Mobility status PT Visit Diagnosis: Other abnormalities of gait and mobility (R26.89);Muscle weakness (generalized) (M62.81)    Time: 3646-8032 PT Time Calculation (min) (ACUTE ONLY): 27 min   Charges:   PT Evaluation $PT Eval Moderate Complexity: 1 Mod        Ina Homes, PT, DPT Acute Rehabilitation Services  Personal: [facility] Office: [phone]  Malachy Chamber 07/05/2022, 3:34 PM

## 2022-07-06 LAB — HEPATIC FUNCTION PANEL
ALT: 52 U/L — ABNORMAL HIGH (ref 0–44)
AST: 52 U/L — ABNORMAL HIGH (ref 15–41)
Albumin: 2.6 g/dL — ABNORMAL LOW (ref 3.5–5.0)
Alkaline Phosphatase: 61 U/L (ref 38–126)
Bilirubin, Direct: 0.2 mg/dL (ref 0.0–0.2)
Indirect Bilirubin: 0.4 mg/dL (ref 0.3–0.9)
Total Bilirubin: 0.6 mg/dL (ref 0.3–1.2)
Total Protein: 6 g/dL — ABNORMAL LOW (ref 6.5–8.1)

## 2022-07-06 LAB — BASIC METABOLIC PANEL
Anion gap: 7 (ref 5–15)
BUN: 7 mg/dL (ref 6–20)
CO2: 26 mmol/L (ref 22–32)
Calcium: 8.4 mg/dL — ABNORMAL LOW (ref 8.9–10.3)
Chloride: 104 mmol/L (ref 98–111)
Creatinine, Ser: 1.04 mg/dL (ref 0.61–1.24)
GFR, Estimated: >60 mL/min (ref >60)
Glucose, Bld: 112 mg/dL — ABNORMAL HIGH (ref 70–99)
Potassium: 3.5 mmol/L (ref 3.5–5.1)
Sodium: 137 mmol/L (ref 135–145)

## 2022-07-06 LAB — CBC
HCT: 33.6 % — ABNORMAL LOW (ref 39.0–52.0)
Hemoglobin: 11.6 g/dL — ABNORMAL LOW (ref 13.0–17.0)
MCH: 25.9 pg — ABNORMAL LOW (ref 26.0–34.0)
MCHC: 34.5 g/dL (ref 30.0–36.0)
MCV: 75 fL — ABNORMAL LOW (ref 80.0–100.0)
Platelets: 234 10*3/uL (ref 150–400)
RBC: 4.48 MIL/uL (ref 4.22–5.81)
RDW: 14.5 % (ref 11.5–15.5)
WBC: 9.6 10*3/uL (ref 4.0–10.5)
nRBC: 0 % (ref 0.0–0.2)

## 2022-07-06 LAB — PHOSPHORUS: Phosphorus: 2.7 mg/dL (ref 2.5–4.6)

## 2022-07-06 LAB — MAGNESIUM: Magnesium: 1.9 mg/dL (ref 1.7–2.4)

## 2022-07-06 MED ORDER — POTASSIUM CHLORIDE CRYS ER 20 MEQ PO TBCR
40.0000 meq | EXTENDED_RELEASE_TABLET | Freq: Once | ORAL | Status: AC
  Administered 2022-07-06: 40 meq via ORAL
  Filled 2022-07-06: qty 2

## 2022-07-06 MED ORDER — MAGNESIUM SULFATE 2 GM/50ML IV SOLN
2.0000 g | Freq: Once | INTRAVENOUS | Status: AC
  Administered 2022-07-06: 2 g via INTRAVENOUS
  Filled 2022-07-06: qty 50

## 2022-07-06 NOTE — TOC Initial Note (Signed)
Transition of Care [facility]) - Initial/Assessment Note    Patient Details  Name: Jeffery Blackwell MRN: [id number] Date of Birth: [birth year]  Transition of Care [facility]) CM/SW Contact:    Bethena Roys, RN Phone Number: 07/06/2022, 1:22 PM  Clinical Narrative:  Patient presented due to PEA arrest. PTA patient states he was from home alone. Patient reports that he drives and gets medications without any issues at [facility]. Patient states he has insurance; no card available and states no one can drop the card off at the hospital. Case Manager will continue to follow for additional transition of care needs as he progresses.                Expected Discharge Plan: Home/Self Care Barriers to Discharge: Continued Medical Work up   Patient Goals and CMS Choice Patient states their goals for this hospitalization and ongoing recovery are:: to return home.      Expected Discharge Plan and Services Expected Discharge Plan: Home/Self Care In-house Referral: NA Discharge Planning Services: CM Consult Post Acute Care Choice: NA Living arrangements for the past 2 months: Apartment                   DME Agency: NA       HH Arranged: NA     Prior Living Arrangements/Services Living arrangements for the past 2 months: Apartment Lives with:: Self Patient language and need for interpreter reviewed:: Yes Do you feel safe going back to the place where you live?: Yes      Need for Family Participation in Patient Care: No (Comment) Care giver support system in place?: No (comment)   Criminal Activity/Legal Involvement Pertinent to Current Situation/Hospitalization: No - Comment as needed   Permission Sought/Granted Permission sought to share information with : Case Manager     Emotional Assessment Appearance:: Appears stated age Attitude/Demeanor/Rapport: Engaged Affect (typically observed): Appropriate Orientation: : Oriented to Situation, Oriented to  Time, Oriented to  Place, Oriented to Self Alcohol / Substance Use: Not Applicable Psych Involvement: No (comment)  Admission diagnosis:  Cardiac arrest ([place]) [I46.9] Hyperkalemia [E87.5] Lactic acidosis [E87.20] Unresponsive [R41.89] Acute respiratory failure with hypoxia and hypercapnia (H[facility]) [J96.01, J96.02] Patient Active Problem List   Diagnosis Date Noted   Cardiac arrest ([place]) 07/04/2022   Acute respiratory failure with hypoxia ([place]) 07/04/2022   AKI (acute kidney injury) ([place]) 07/04/2022   Shock ([place]) 07/04/2022   Mild concussion 07/12/2018   PCP:  Patient, No Pcp Per Pharmacy:   [id number] - [place], [place] [address] Phone: [phone] Fax: [phone]  [place] [address], [place] [address] Phone: [phone] Fax: [phone]   Readmission Risk Interventions     No data to display

## 2022-07-06 NOTE — Progress Notes (Signed)
Physical Therapy Treatment Patient Details Name: Jeffery Blackwell MRN: [id number] DOB: May 22, 1981 Today's Date: 07/06/2022   History of Present Illness Pt is a 41 y.o. male admitted 07/03/22 after found unresponsive in car, PEA cardiac arrest, drug OD, reports of decorticate posturing; UDS (+) opioids, cocaine, benzos. ETT 10/29-10/31. S/p bronchoscopy 10/30. EEG 10/30 suggestive of moderate diffuse encephalopathy; no seizures. MRI 10/31 Patchy restricted diffusion involving L > R globus palladi; nonspecific findings, differential considerations of CO poisoning, hypoxic ischemic encephalopathy, or changes related to drug abuse. PMH includes polysubstance use, asthma.   PT Comments    Pt progressing with mobility. Today's session focused on gait training without DME. Pt demonstrates improving activity tolerance, though still with apparent instability, slowed processing and decreased awareness. Pt now out of ICU, encouraged more frequent out of bed activity with nursing and mobility specialists. Will continue to follow acutely to address established goals.    Recommendations for follow up therapy are one component of a multi-disciplinary discharge planning process, led by the attending physician.  Recommendations may be updated based on patient status, additional functional criteria and insurance authorization.  Follow Up Recommendations  Outpatient PT     Assistance Recommended at Discharge Intermittent Supervision/Assistance  Patient can return home with the following A little help with bathing/dressing/bathroom;Assistance with cooking/housework;Assist for transportation;Direct supervision/assist for medications management;Direct supervision/assist for financial management   Equipment Recommendations  None recommended by PT    Recommendations for Other Services  Mobility Specialist     Precautions / Restrictions Precautions Precautions: Fall Restrictions Weight Bearing Restrictions: No      Mobility  Bed Mobility Overal bed mobility: Needs Assistance Bed Mobility: Supine to Sit     Supine to sit: Supervision     General bed mobility comments: repeated cues to complete task    Transfers Overall transfer level: Needs assistance Equipment used: None Transfers: Sit to/from Stand Sit to Stand: Min guard           General transfer comment: min guard for balance standing without DME    Ambulation/Gait Ambulation/Gait assistance: Min guard Gait Distance (Feet): 180 Feet Assistive device: None Gait Pattern/deviations: Step-through pattern, Decreased stride length Gait velocity: Decreased     General Gait Details: slow, fatigued, mildly unsteady gait with close min guard for balance; pt requiring increased time to navigate around obstacles in room, intermittent cues for safety   Stairs             Wheelchair Mobility    Modified Rankin (Stroke Patients Only)       Balance Overall balance assessment: Needs assistance Sitting-balance support: No upper extremity supported, Feet supported Sitting balance-Leahy Scale: Good     Standing balance support: During functional activity, No upper extremity supported Standing balance-Leahy Scale: Fair                              Cognition Arousal/Alertness: Awake/alert Behavior During Therapy: Flat affect Overall Cognitive Status: Impaired/Different from baseline Area of Impairment: Attention, Following commands, Safety/judgement, Awareness, Problem solving                   Current Attention Level: Selective   Following Commands: Follows one step commands consistently, Follows one step commands with increased time Safety/Judgement: Decreased awareness of deficits Awareness: Emergent Problem Solving: Slow processing, Requires verbal cues General Comments: pt reports his cognition is normal. friend visiting and reports pt is "still sharp" but agrees pt responding slower than  normal        Exercises      General Comments General comments (skin integrity, edema, etc.): pt's friend present and supportive. educ pt on importance of increased OOB mobility, including working with mobility specialist      Pertinent Vitals/Pain Pain Assessment Pain Assessment: Faces Faces Pain Scale: Hurts a little bit Pain Location: generalized Pain Descriptors / Indicators: Tiring Pain Intervention(s): Monitored during session    Home Living Family/patient expects to be discharged to:: Private residence Living Arrangements: Children                      Prior Function            PT Goals (current goals can now be found in the care plan section) Progress towards PT goals: Progressing toward goals    Frequency    Min 3X/week      PT Plan Current plan remains appropriate    Co-evaluation              AM-PAC PT "6 Clicks" Mobility   Outcome Measure  Help needed turning from your back to your side while in a flat bed without using bedrails?: None Help needed moving from lying on your back to sitting on the side of a flat bed without using bedrails?: A Little Help needed moving to and from a bed to a chair (including a wheelchair)?: A Little Help needed standing up from a chair using your arms (e.g., wheelchair or bedside chair)?: A Little Help needed to walk in hospital room?: A Little Help needed climbing 3-5 steps with a railing? : A Little 6 Click Score: 19    End of Session Equipment Utilized During Treatment: Gait belt Activity Tolerance: Patient tolerated treatment well Patient left: in bed;with call bell/phone within reach;with bed alarm set;with family/visitor present Nurse Communication: Mobility status PT Visit Diagnosis: Other abnormalities of gait and mobility (R26.89);Muscle weakness (generalized) (M62.81)     Time: [id number] PT Time Calculation (min) (ACUTE ONLY): 19 min  Charges:  $Gait Training: 8-22 mins                      Mabeline Caras, PT, DPT Acute Rehabilitation Services  Personal: [place] [facility] Office: [place] 07/06/2022, 5:06 PM

## 2022-07-06 NOTE — Progress Notes (Signed)
NAME:  Jeffery Blackwell, MRN:  [id number], DOB:  1981/08/27, LOS: 2 ADMISSION DATE:  07/03/2022, CONSULTATION DATE:  10/30 REFERRING MD:  Mesner, CHIEF COMPLAINT:  unresponsive   History of Present Illness:  Jeffery Blackwell, is a 41 y.o. male, who presented to the [facility] ED by EMS after being found unresponsive in the car.  They have a pertinent past medical history of asthma  Prior to arrival patient seen by bystanders sitting in car for several hours.  Was found to be unresponsive sitting up in car seat.  Per EMS patient agonal he breathing with pinpoint pupils.  1 mg Narcan given with improvement in breathing but no improvement in responsiveness.  Reports of decorticate posturing.  On arrival to the ED he was found to be GCS of 3.  He was intubated.  Shortly after intubation he developed in hospital cardiac arrest- PEA.  2 epi given, for 5 minutes of CPR total.  ABG with severe respiratory acidosis.  He later became hypoxic had another PEA arrest for about 2 minutes.  COOX panel within normal limits.  UDS positive for opioids, cocaine, benzos.  Lactate 5.5 > 5.9.   PCCM was consulted for admission.  Pertinent  Medical History  Asthma  Significant Hospital Events: Including procedures, antibiotic start and stop dates in addition to other pertinent events   10/29 presented to [facility] ED, PEA arrest 10/30 PCCM consult  Interim History / Subjective:  Patient was successfully extubated yesterday Remain afebrile  Objective   Blood pressure (!) 132/91, pulse 83, temperature 98.4 F (36.9 C), resp. rate 20, height 6' (1.829 m), weight 133.2 kg, SpO2 97 %.        Intake/Output Summary (Last 24 hours) at 07/06/2022 1119 Last data filed at 07/06/2022 1000 Gross per 24 hour  Intake 962.88 ml  Output 1250 ml  Net -287.12 ml   Filed Weights   07/04/22 0600 07/05/22 0500 07/06/22 0500  Weight: 134.6 kg 135.9 kg 133.2 kg    Examination: Physical exam: General: Middle-age male, lying on the  bed HEENT: [place]/AT, eyes anicteric.  moist mucus membranes Neuro: Alert, awake following commands Chest: Coarse breath sounds, no wheezes or rhonchi Heart: Regular rate and rhythm, no murmurs or gallops Abdomen: Soft, nontender, nondistended, bowel sounds present Skin: No rash    Resolved Hospital Problem list     Assessment & Plan:  S/p PEA cardiac arrest due to hypoxia in the setting of drug overdose septic shock due to aspiration pneumonia Demand cardiac ischemia Lactic acidosis, secondary to cardiac arrest and shock Polysubstance abuse Continue supportive care Patient is awake, following commands Continue IV antibiotics to complete 5 days therapy Vasopressors were titrated off Lactate trended down  Acute toxic/septic encephalopathy In the setting of drug abuse and aspiration pneumonia Mental status has improved Avoid sedation  Left basal ganglia toxic injury Initial CT scan showed left basal ganglia hypodensity consistent with acute stroke MRI brain showed changes likely due to toxic exposure not due to ischemic stroke Antiplatelets were stopped Continue neuro watch Spot EEG was negative   Acute hypoxic/hypercapnic respiratory failure Aspiration pneumonia Hypercapnia has cleared He was extubated yesterday Continue IV Unasyn Culture growing mixed flora  AKI due to septic ATN Hypokalemia/hypomagnesemia/hypophosphatemia Creatinine 1.41 (baseline 0.9-1) Serum creatinine continue to improve Continue aggressive electrolyte supplement and monitor  Hyperkalemia, resolved  Shock liver in the setting of cardiac arrest LFTs are improving  Morbid obesity Dietitian follow-up Counseling provided Diet and exercise   Best Practice (right click and "  Reselect all SmartList Selections" daily)   Diet/type: Diet DVT prophylaxis: prophylactic heparin  GI prophylaxis: PPI Lines: N/A Foley: Discontinue Code Status:  full code Last date of multidisciplinary goals of care  discussion [11/1: Patient was updated at bedside  Labs   CBC: Recent Labs  Lab 07/03/22 2242 07/03/22 2250 07/03/22 2251 07/03/22 2343 07/04/22 0555 07/05/22 0436 07/06/22 0452  WBC 16.5*  --   --   --  12.9* 12.2* 9.6  NEUTROABS 14.5*  --   --   --   --   --   --   HGB 14.4   < > 15.3 14.6 13.5 13.4 11.6*  HCT 43.7   < > 45.0 43.0 38.9* 36.9* 33.6*  MCV 79.5*  --   --   --  75.0* 73.4* 75.0*  PLT 282  --   --   --  291 259 234   < > = values in this interval not displayed.    Basic Metabolic Panel: Recent Labs  Lab 07/03/22 2242 07/03/22 2250 07/03/22 2251 07/03/22 2343 07/04/22 0555 07/05/22 0436 07/05/22 1952 07/06/22 0452  NA 138 139 139 138  --  137  --  137  K 5.6* 5.5* 5.5* 4.1  --  3.2*  --  3.5  CL 105  --  103  --   --  105  --  104  CO2 23  --   --   --   --  23  --  26  GLUCOSE 165*  --  159*  --   --  154*  --  112*  BUN 20  --  26*  --   --  9  --  7  CREATININE 1.47*  --  1.40*  --  1.16 1.10  --  1.04  CALCIUM 9.2  --   --   --   --  8.9  --  8.4*  MG 2.2  --   --   --   --  1.5*  --  1.9  PHOS  --   --   --   --   --  <1.0* 3.5 2.7   GFR: Estimated Creatinine Clearance: 131.9 mL/min (by C-G formula based on SCr of 1.04 mg/dL). Recent Labs  Lab 07/03/22 2242 07/04/22 0102 07/04/22 0555 07/04/22 0734 07/05/22 0436 07/06/22 0452  WBC 16.5*  --  12.9*  --  12.2* 9.6  LATICACIDVEN 5.5* 5.9*  --  2.1*  --   --     Liver Function Tests: Recent Labs  Lab 07/03/22 2242 07/05/22 0436 07/06/22 0452  AST 103* 58* 52*  ALT 92* 75* 52*  ALKPHOS 73 58 61  BILITOT 0.2* 1.1 0.6  PROT 7.0 6.2* 6.0*  ALBUMIN 3.6 2.9* 2.6*   No results for input(s): "LIPASE", "AMYLASE" in the last 168 hours. No results for input(s): "AMMONIA" in the last 168 hours.  ABG    Component Value Date/Time   PHART 7.285 (L) 07/03/2022 2343   PCO2ART 41.8 07/03/2022 2343   PO2ART 48 (L) 07/03/2022 2343   HCO3 20.0 07/03/2022 2343   TCO2 21 (L) 07/03/2022 2343    ACIDBASEDEF 7.0 (H) 07/03/2022 2343   O2SAT 80 07/03/2022 2343     Coagulation Profile: Recent Labs  Lab 07/03/22 2242  INR 1.0    Cardiac Enzymes: No results for input(s): "CKTOTAL", "CKMB", "CKMBINDEX", "TROPONINI" in the last 168 hours.  HbA1C: No results found for: "HGBA1C"  CBG: Recent Labs  Lab 07/04/22 0737  07/04/22 1151 07/05/22 0749 07/05/22 1125 07/05/22 1537  GLUCAP 121* 167* 130* 122* 144*      Jacky Kindle, MD [facility] See Amion for pager If no response to pager, please call [phone] until 7pm After 7pm, Please call E-link [phone]

## 2022-07-07 ENCOUNTER — Inpatient Hospital Stay (HOSPITAL_COMMUNITY): Payer: Self-pay

## 2022-07-07 DIAGNOSIS — E876 Hypokalemia: Secondary | ICD-10-CM | POA: Insufficient documentation

## 2022-07-07 DIAGNOSIS — K7200 Acute and subacute hepatic failure without coma: Secondary | ICD-10-CM | POA: Insufficient documentation

## 2022-07-07 DIAGNOSIS — F1910 Other psychoactive substance abuse, uncomplicated: Secondary | ICD-10-CM | POA: Insufficient documentation

## 2022-07-07 DIAGNOSIS — G9341 Metabolic encephalopathy: Secondary | ICD-10-CM

## 2022-07-07 DIAGNOSIS — J690 Pneumonitis due to inhalation of food and vomit: Secondary | ICD-10-CM

## 2022-07-07 DIAGNOSIS — I2489 Other forms of acute ischemic heart disease: Secondary | ICD-10-CM | POA: Insufficient documentation

## 2022-07-07 DIAGNOSIS — R220 Localized swelling, mass and lump, head: Secondary | ICD-10-CM

## 2022-07-07 DIAGNOSIS — M7989 Other specified soft tissue disorders: Secondary | ICD-10-CM

## 2022-07-07 DIAGNOSIS — E66813 Obesity, class 3: Secondary | ICD-10-CM | POA: Insufficient documentation

## 2022-07-07 DIAGNOSIS — E875 Hyperkalemia: Secondary | ICD-10-CM | POA: Insufficient documentation

## 2022-07-07 DIAGNOSIS — R52 Pain, unspecified: Secondary | ICD-10-CM

## 2022-07-07 LAB — BASIC METABOLIC PANEL
Anion gap: 7 (ref 5–15)
BUN: 6 mg/dL (ref 6–20)
CO2: 24 mmol/L (ref 22–32)
Calcium: 8.9 mg/dL (ref 8.9–10.3)
Chloride: 105 mmol/L (ref 98–111)
Creatinine, Ser: 0.98 mg/dL (ref 0.61–1.24)
GFR, Estimated: >60 mL/min (ref >60)
Glucose, Bld: 115 mg/dL — ABNORMAL HIGH (ref 70–99)
Potassium: 3.7 mmol/L (ref 3.5–5.1)
Sodium: 136 mmol/L (ref 135–145)

## 2022-07-07 LAB — CBC
HCT: 36.6 % — ABNORMAL LOW (ref 39.0–52.0)
Hemoglobin: 12.7 g/dL — ABNORMAL LOW (ref 13.0–17.0)
MCH: 25.9 pg — ABNORMAL LOW (ref 26.0–34.0)
MCHC: 34.7 g/dL (ref 30.0–36.0)
MCV: 74.7 fL — ABNORMAL LOW (ref 80.0–100.0)
Platelets: 265 10*3/uL (ref 150–400)
RBC: 4.9 MIL/uL (ref 4.22–5.81)
RDW: 14.1 % (ref 11.5–15.5)
WBC: 8.1 10*3/uL (ref 4.0–10.5)
nRBC: 0 % (ref 0.0–0.2)

## 2022-07-07 LAB — HEPATIC FUNCTION PANEL
ALT: 47 U/L — ABNORMAL HIGH (ref 0–44)
AST: 48 U/L — ABNORMAL HIGH (ref 15–41)
Albumin: 2.9 g/dL — ABNORMAL LOW (ref 3.5–5.0)
Alkaline Phosphatase: 67 U/L (ref 38–126)
Bilirubin, Direct: 0.1 mg/dL (ref 0.0–0.2)
Indirect Bilirubin: 0.4 mg/dL (ref 0.3–0.9)
Total Bilirubin: 0.5 mg/dL (ref 0.3–1.2)
Total Protein: 6.5 g/dL (ref 6.5–8.1)

## 2022-07-07 LAB — PHOSPHORUS: Phosphorus: 2.3 mg/dL — ABNORMAL LOW (ref 2.5–4.6)

## 2022-07-07 LAB — MAGNESIUM: Magnesium: 1.9 mg/dL (ref 1.7–2.4)

## 2022-07-07 MED ORDER — ALBUTEROL SULFATE (2.5 MG/3ML) 0.083% IN NEBU
2.5000 mg | INHALATION_SOLUTION | RESPIRATORY_TRACT | Status: DC | PRN
  Administered 2022-07-07: 2.5 mg via RESPIRATORY_TRACT
  Filled 2022-07-07: qty 3

## 2022-07-07 MED ORDER — APIXABAN 5 MG PO TABS
10.0000 mg | ORAL_TABLET | Freq: Two times a day (BID) | ORAL | Status: DC
  Administered 2022-07-07 – 2022-07-09 (×4): 10 mg via ORAL
  Filled 2022-07-07 (×4): qty 2

## 2022-07-07 MED ORDER — CEFDINIR 300 MG PO CAPS
300.0000 mg | ORAL_CAPSULE | Freq: Two times a day (BID) | ORAL | Status: AC
  Administered 2022-07-07 – 2022-07-08 (×4): 300 mg via ORAL
  Filled 2022-07-07 (×5): qty 1

## 2022-07-07 MED ORDER — APIXABAN 5 MG PO TABS: 5.0000 mg | ORAL_TABLET | Freq: Two times a day (BID) | ORAL | Status: DC

## 2022-07-07 MED ORDER — METRONIDAZOLE 500 MG PO TABS
500.0000 mg | ORAL_TABLET | Freq: Two times a day (BID) | ORAL | Status: AC
  Administered 2022-07-07 – 2022-07-08 (×4): 500 mg via ORAL
  Filled 2022-07-07 (×4): qty 1

## 2022-07-07 NOTE — Assessment & Plan Note (Signed)
Mild troponin leak in setting of septic shock and PEA arrest.

## 2022-07-07 NOTE — Assessment & Plan Note (Signed)
Treated and resolved °

## 2022-07-07 NOTE — TOC Progression Note (Signed)
Transition of Care [facility]) - Progression Note    Patient Details  Name: Jeffery Blackwell MRN: [id number] Date of Birth: [birth year]  Transition of Care [facility]) CM/SW [place], RN Phone Number: 07/07/2022, 3:41 PM  Clinical Narrative:    CM met with the patient at the bedside.  CAGE Aid provided and Substance abuse counseling follow up provided in the discharge instructions.  PCP was set up with Juluis Mire, NP at [facility] on August 03, 2022 at 2:10 pm.  PT/OT continues provide therapy as inpatient.  Discharge medications to be provided through [facility] since the patient has no available insurance at this time.  The patient states that he worked in [employer] and was able to drive before admission to the hospital.  CM will continue to follow the patient for discharge planning for return to home - no pending discharge date at this time.   Expected Discharge Plan: OP Rehab Barriers to Discharge: Continued Medical Work up  Expected Discharge Plan and Services Expected Discharge Plan: OP Rehab In-house Referral: PCP / [facility] Discharge Planning Services: CM Consult Post Acute Care Choice: NA Living arrangements for the past 2 months: Apartment                   DME Agency: NA       HH Arranged: NA           Social Determinants of Health (SDOH) Interventions    Readmission Risk Interventions    07/07/2022    3:40 PM  Readmission Risk Prevention Plan  Post Dischage Appt Complete  Medication Screening Complete  Transportation Screening Complete

## 2022-07-07 NOTE — Hospital Course (Addendum)
Mr. Rabalais is a 41 y.o. M with morbid obesity, asthma, gonorrhea, polysubstance abuse who was found down.  Patient evidently observed by bystanders standing in a car for several hours, then noted to be unresponsive.  EMS found patient with agonal breathing, pinpoint pupils, Narcan given with some improvement in breathing.   In the ER, GCS 3, intubated.  UDS positive for cocaine, opiates, benzodiazepines.  CXR clear. Lactic acid 5.5.  Shortly after intubation, developed PEA arrest.  Later became hypoxic and had 2nd PEA arrest.     10/29: Admitted, intubated, suffered 2 PEA arrests, CXR clear 10/30: Bronch showed thick tenacious secretions throughout, suction and BAL performed in the right upper lobe  10/31: Extubated, MRI shows HIE 11/2: Transferred OOU 11/2: US shows DVT in LUE, Eliquis started

## 2022-07-07 NOTE — Assessment & Plan Note (Addendum)
MRI brain showed patchy restricted diffusion in bilateral BG.  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  Stroke ruled out.

## 2022-07-07 NOTE — Plan of Care (Signed)
  Problem: Education: Goal: Ability to manage disease process will improve Outcome: Progressing   Problem: Cardiac: Goal: Ability to achieve and maintain adequate cardiopulmonary perfusion will improve Outcome: Progressing   Problem: Neurologic: Goal: Promote progressive neurologic recovery Outcome: Progressing   Problem: Skin Integrity: Goal: Risk for impaired skin integrity will be minimized. Outcome: Progressing   Problem: Education: Goal: Knowledge of General Education information will improve Description: Including pain rating scale, medication(s)/side effects and non-pharmacologic comfort measures Outcome: Progressing   Problem: Health Behavior/Discharge Planning: Goal: Ability to manage health-related needs will improve Outcome: Progressing   Problem: Clinical Measurements: Goal: Ability to maintain clinical measurements within normal limits will improve Outcome: Progressing Goal: Will remain free from infection Outcome: Progressing Goal: Diagnostic test results will improve Outcome: Progressing Goal: Respiratory complications will improve Outcome: Progressing Goal: Cardiovascular complication will be avoided Outcome: Progressing   Problem: Activity: Goal: Risk for activity intolerance will decrease Outcome: Progressing   Problem: Nutrition: Goal: Adequate nutrition will be maintained Outcome: Progressing   Problem: Coping: Goal: Level of anxiety will decrease Outcome: Progressing   Problem: Elimination: Goal: Will not experience complications related to bowel motility Outcome: Progressing Goal: Will not experience complications related to urinary retention Outcome: Progressing   Problem: Pain Managment: Goal: General experience of comfort will improve Outcome: Progressing   Problem: Safety: Goal: Ability to remain free from injury will improve Outcome: Progressing   Problem: Skin Integrity: Goal: Risk for impaired skin integrity will  decrease Outcome: Progressing   

## 2022-07-07 NOTE — Assessment & Plan Note (Signed)
UDS positive for opiates, benzodiazepins and cocaine at admission

## 2022-07-07 NOTE — Assessment & Plan Note (Signed)
Resolved

## 2022-07-07 NOTE — Assessment & Plan Note (Signed)
BMI 40.6

## 2022-07-07 NOTE — Assessment & Plan Note (Addendum)
Due to drug overdose and aspiration pneumonia.  CT chest showed complete right upper lobe collapse and subtotal bilateral lower lobe collapse with extensive airway impaction in the right upper lobe and right lower lobe.  S/p bronch 10/30 with BAL, rare MSSA  Now weaned to room air - Continue antibiotics - Aggressive pumonary toilet

## 2022-07-07 NOTE — Assessment & Plan Note (Signed)
Due to sepsis, respiratory failure, and drug overdose.  Resolved

## 2022-07-07 NOTE — TOC CAGE-AID Note (Signed)
Transition of Care [facility]) - CAGE-AID Screening   Patient Details  Name: Jeffery Blackwell MRN: [id number] Date of Birth: [birth year]  Transition [facility]) CM/SW Contact:    Curlene Labrum, RN Phone Number: 07/07/2022, 3:40 PM   Clinical Narrative: CAGE Aid Screening completed and Substance Abuse OP Counseling provided in the discharge instructions / follow up.   CAGE-AID Screening:    Have You Ever Felt You Ought to Cut Down on Your Drinking or Drug Use?: No Have People Annoyed You By Critizing Your Drinking Or Drug Use?: No Have You Felt Bad Or Guilty About Your Drinking Or Drug Use?: No Have You Ever Had a Drink or Used Drugs First Thing In The Morning to Steady Your Nerves or to Get Rid of a Hangover?: No CAGE-AID Score: 0  Substance Abuse Education Offered: Yes  Substance abuse interventions: Patient Counseling, Scientist, clinical (histocompatibility and immunogenetics)

## 2022-07-07 NOTE — Assessment & Plan Note (Addendum)
Improving on Zosyn.  BAL with rare Staph aureus, MSSA - Continue cefdinir and Flagyl

## 2022-07-07 NOTE — Progress Notes (Signed)
Occupational Therapy Treatment Patient Details Name: Jeffery Blackwell MRN: [id number] DOB: 10-Feb-1981 Today's Date: 07/07/2022   History of present illness Pt is a 41 y.o. male admitted 07/03/22 after found unresponsive in car, PEA cardiac arrest, drug OD, reports of decorticate posturing; UDS (+) opioids, cocaine, benzos. ETT 10/29-10/31. S/p bronchoscopy 10/30. EEG 10/30 suggestive of moderate diffuse encephalopathy; no seizures. MRI 10/31 Patchy restricted diffusion involving L > R globus palladi; nonspecific findings, differential considerations of CO poisoning, hypoxic ischemic encephalopathy, or changes related to drug abuse. PMH includes polysubstance use, asthma.   OT comments  Patient received in supine and demonstrated swollen upper lip, nursing aware. Patient able to get to EOB without assistance and performed toilet transfer training and mobility in room without an assistive device and min guard assist for safety. Patient performed item retrieval to address following commands with min guard assist and no cues for instructions once given. Patient provided yellow therapy band and instructed on BUE shoulder exercises with patient demonstrating LUE weakness and difficulty holding onto band. Patient to continue to be followed by acute OT.    Recommendations for follow up therapy are one component of a multi-disciplinary discharge planning process, led by the attending physician.  Recommendations may be updated based on patient status, additional functional criteria and insurance authorization.    Follow Up Recommendations  Outpatient OT (pending progress)    Assistance Recommended at Discharge Frequent or constant Supervision/Assistance  Patient can return home with the following  A little help with walking and/or transfers;A lot of help with bathing/dressing/bathroom;Assistance with cooking/housework;Direct supervision/assist for medications management;Direct supervision/assist for financial  management;Assist for transportation;Help with stairs or ramp for entrance   Equipment Recommendations  Other (comment) (pending progress)    Recommendations for Other Services      Precautions / Restrictions Precautions Precautions: Fall Restrictions Weight Bearing Restrictions: No       Mobility Bed Mobility Overal bed mobility: Needs Assistance Bed Mobility: Supine to Sit     Supine to sit: Supervision     General bed mobility comments: increased time    Transfers Overall transfer level: Needs assistance Equipment used: None Transfers: Sit to/from Stand Sit to Stand: Min guard           General transfer comment: min guard for mobilty and transfers     Balance Overall balance assessment: Needs assistance Sitting-balance support: No upper extremity supported, Feet supported Sitting balance-Leahy Scale: Good     Standing balance support: During functional activity, No upper extremity supported Standing balance-Leahy Scale: Fair Standing balance comment: min guard for static standing balance                           ADL either performed or assessed with clinical judgement   ADL Overall ADL's : Needs assistance/impaired     Grooming: Wash/dry hands;Wash/dry face;Set up;Sitting                   Toilet Transfer: Hydrographic surveyor Details (indicate cue type and reason): performed transfers to/from toilet with min guard for safety           General ADL Comments: able to follow muli step commands for item gathering for self care    Extremity/Trunk Assessment Upper Extremity Assessment LUE Deficits / Details: mild edema, decreased coordination, weak grasp LUE Sensation: WNL LUE Coordination: decreased fine motor;decreased gross motor            Vision  Perception     Praxis      Cognition Arousal/Alertness: Awake/alert Behavior During Therapy: Flat affect Overall Cognitive Status: Impaired/Different from  baseline Area of Impairment: Attention, Following commands, Safety/judgement, Awareness, Problem solving                   Current Attention Level: Selective   Following Commands: Follows one step commands consistently, Follows one step commands with increased time, Follows multi-step commands with increased time Safety/Judgement: Decreased awareness of deficits Awareness: Emergent Problem Solving: Slow processing, Requires verbal cues General Comments: ablet to follow multi step commands with increased time        Exercises Exercises: General Upper Extremity General Exercises - Upper Extremity Shoulder Flexion: Strengthening, Both, 10 reps, Seated, Theraband Theraband Level (Shoulder Flexion): Level 1 (Yellow) Shoulder ABduction: Strengthening, Both, 10 reps, Seated, Theraband Theraband Level (Shoulder Abduction): Level 1 (Yellow)    Shoulder Instructions       General Comments      Pertinent Vitals/ Pain       Pain Assessment Pain Assessment: Faces Faces Pain Scale: Hurts a little bit Pain Location: upper lip, swollen Pain Descriptors / Indicators: Sore Pain Intervention(s): Monitored during session  Home Living                                          Prior Functioning/Environment              Frequency  Min 2X/week        Progress Toward Goals  OT Goals(current goals can now be found in the care plan section)  Progress towards OT goals: Progressing toward goals  Acute Rehab OT Goals Patient Stated Goal: get better OT Goal Formulation: With patient Time For Goal Achievement: 07/19/22 Potential to Achieve Goals: Good ADL Goals Pt Will Perform Grooming: with modified independence;sitting;standing Pt Will Perform Lower Body Dressing: with supervision;with adaptive equipment;sit to/from stand Pt Will Transfer to Toilet: with supervision;ambulating;bedside commode Pt Will Perform Toileting - Clothing Manipulation and hygiene:  with modified independence;sit to/from stand Pt/caregiver will Perform Home Exercise Program: Increased strength;Both right and left upper extremity;With written HEP provided Additional ADL Goal #1: Pt will demonstrate ability to complete 3 step trail making task with no more than supervision.  Plan Discharge plan remains appropriate    Co-evaluation                 AM-PAC OT "6 Clicks" Daily Activity     Outcome Measure   Help from another person eating meals?: A Little Help from another person taking care of personal grooming?: A Little Help from another person toileting, which includes using toliet, bedpan, or urinal?: A Little Help from another person bathing (including washing, rinsing, drying)?: A Little Help from another person to put on and taking off regular upper body clothing?: A Little Help from another person to put on and taking off regular lower body clothing?: A Little 6 Click Score: 18    End of Session Equipment Utilized During Treatment: Gait belt  OT Visit Diagnosis: Other abnormalities of gait and mobility (R26.89);Muscle weakness (generalized) (M62.81);Other symptoms and signs involving cognitive function   Activity Tolerance Patient tolerated treatment well   Patient Left in chair;with call bell/phone within reach;with chair alarm set   Nurse Communication Mobility status        Time: 6433-2951 OT Time Calculation (min): 23 min  Charges: OT General Charges $OT Visit: 1 Visit OT Treatments $Self Care/Home Management : 8-22 mins $Therapeutic Exercise: 8-22 mins  Alfonse Flavors, OTA Acute Rehabilitation Services  Office [phone]   Dewain Penning 07/07/2022, 12:23 PM

## 2022-07-07 NOTE — Assessment & Plan Note (Addendum)
No rash or itching to suggest angioedema.  Has been on Zosyn for 3 days without progression or airway compromise.  Suspect this is from ETT trauma.    Seeems better today, I suspect ETT trauma - Monitor

## 2022-07-07 NOTE — Assessment & Plan Note (Signed)
Cr 1.4 on admission, now resolved to baseline <1.

## 2022-07-07 NOTE — Progress Notes (Signed)
LUE venous duplex has been completed.  Preliminary findings given to [facility], Therapist, sports.   Results can be found under chart review under CV PROC. 07/07/2022 5:46 PM Alwilda Gilland RVT, RDMS

## 2022-07-07 NOTE — Progress Notes (Signed)
Mobility Specialist Progress Note   07/07/22 1345  Mobility  Activity Ambulated with assistance in hallway  Level of Assistance Independent  Assistive Device None  Distance Ambulated (ft) 500 ft  Range of Motion/Exercises Active;All extremities  Activity Response Tolerated well   Patient received in bed asleep and easily aroused, agreeable to participate. Ambulated independently with steady gait. Returned to room without complaint or incident. Was left in supine with all needs met, call bell in reach.   Jeffery Blackwell, [place], [place]  Office: [phone]

## 2022-07-07 NOTE — Assessment & Plan Note (Addendum)
-   Follow up with PCP, if still elevated, [place] and hepatitis serologies

## 2022-07-07 NOTE — Progress Notes (Signed)
Progress Note   Patient: Jeffery Blackwell KNL:[id number] DOB: 1981/08/30 DOA: 07/03/2022     3 DOS: the patient was seen and examined on 07/07/2022 at 1006      Brief hospital course: Jeffery Blackwell is a 41 y.o. M with morbid obesity, asthma, gonorrhea, polysubstance abuse who was found down.  Patient evidently observed by bystanders standing in a car for several hours, then noted to be unresponsive.  EMS found patient with agonal breathing, pinpoint pupils, Narcan given with some improvement in breathing.   In the ER, GCS 3, intubated.  UDS positive for cocaine, opiates, benzodiazepines.  CXR clear. Lactic acid 5.5.  Shortly after intubation, developed PEA arrest.  Later became hypoxic and had 2nd PEA arrest.     10/29: Admitted, intubated, suffered 2 PEA arrests, CXR clear 10/30: Bronch showed thick tenacious secretions throughout, suction and BAL performed in the right upper lobe  10/31: Extubated, MRI shows HIE     Assessment and Plan: * PEA cardiac arrest Due to hypoxia from drug overdose and aspiration pneumonia.    Now hemodynamically stable last 24 hours.  Weaned to room air. - Obtain cardiac arrest  Acute respiratory failure with hypoxia and hypercapnia (HCC) Due to drug overdose and aspiration pneumonia.  CT chest showed complete right upper lobe collapse and subtotal bilateral lower lobe collapse with extensive airway impaction in the right upper lobe and right lower lobe.  S/p bronch 10/30 with BAL, rare staph  Now weaned to room air - Continue pulmonary toilet, chest PT  Aspiration pneumonia ([place]) Improving on Zosyn.  BAL with rare Staph aureus. - Follow BAL culture - Continue antibiotics, narrow to cefdinir and Flagyl  Lip swelling No rash or itching to suggest angioedema.  Has been on Zosyn for 3 days without progression or airway compromise.  Suspect this is from ETT trauma.   - Monitor  Polysubstance abuse (HCC) UDS positive for opiates, benzodiazepins and  cocaine at admission  Demand ischemia Mild troponin leak in setting of septic shock and PEA arrest.  Obesity, Class III, BMI 40-49.9 (morbid obesity) (HCC) BMI 40.6  Shock liver Resolved  Hyperkalemia Resolved  Hypomagnesemia Treated and resolved  Hypokalemia Treated and resolved  Acute metabolic encephalopathy Due to sepsis, respiratory failure, and drug overdose.  Resolved  Hypoxic ischemic encephalopathy (HIE) MRI brain showed patchy restricted diffusion in bilateral BG.  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  Stroke ruled out.  Shock ([place]) Presented with tachycardia, tachypnea, encephalopathy, hypotension requiring pressors and respiratory failure requiring intubation, in setting of septic shock due to aspiration pneumonia. - See above  AKI (acute kidney injury) ([place]) Cr 1.4 on admission, now resolved to baseline <1.          Subjective: No new fever, confusion, itching, rash.  Lip swelling is persistent, no change, no difficulty breathing.  He has some wheezing due to his chronic asthma, he has cough productive of thick sputum, no dyspnea, no stridor.  No focal weakness       Physical Exam: BP 136/84 (BP Location: Right Arm)   Pulse 72   Temp 99 F (37.2 C)   Resp 17   Ht 6' (1.829 m)   Wt 136 kg   SpO2 99%   BMI 40.66 kg/m   Obese adult male, lying in bed, appears weak and tired Upper lip is swollen, not firm, nontender, nonfluctuant, tongue normal, oropharynx tacky dry, dentition in average repair No rashes of the back, chest, abdomen, arms,  or legs. RRR, no murmurs, no peripheral edema Respiratory rate normal, scant wheezes noted, overall lung sounds diminished Abdomen soft without tenderness palpation, no guarding Oriented to person, place, time, attention normal, affect appropriate, judgment and insight appear normal    Data Reviewed: Critical care CBC unremarkable Basic metabolic panel and  magnesium normal LFTs improving  Family Communication: None present    Disposition: Status is: Inpatient         Author: Edwin Dada, MD 07/07/2022 2:14 PM  For on call review [email or web address].

## 2022-07-07 NOTE — Assessment & Plan Note (Addendum)
Due to hypoxia from drug overdose and aspiration pneumonia.    Remains hemodynamically stable and on room air - Follow echo, ordered yesterday

## 2022-07-07 NOTE — Assessment & Plan Note (Signed)
Presented with tachycardia, tachypnea, encephalopathy, hypotension requiring pressors and respiratory failure requiring intubation, in setting of septic shock due to aspiration pneumonia. - See above

## 2022-07-08 ENCOUNTER — Inpatient Hospital Stay (HOSPITAL_COMMUNITY): Payer: Self-pay

## 2022-07-08 DIAGNOSIS — I82622 Acute embolism and thrombosis of deep veins of left upper extremity: Secondary | ICD-10-CM

## 2022-07-08 DIAGNOSIS — I469 Cardiac arrest, cause unspecified: Secondary | ICD-10-CM

## 2022-07-08 LAB — ECHOCARDIOGRAM COMPLETE
Area-P 1/2: 3.54 cm2
Height: 72 in
S' Lateral: 2.3 cm
Weight: 4797.21 oz

## 2022-07-08 LAB — COMPREHENSIVE METABOLIC PANEL
ALT: 63 U/L — ABNORMAL HIGH (ref 0–44)
AST: 75 U/L — ABNORMAL HIGH (ref 15–41)
Albumin: 3 g/dL — ABNORMAL LOW (ref 3.5–5.0)
Alkaline Phosphatase: 87 U/L (ref 38–126)
Anion gap: 5 (ref 5–15)
BUN: 9 mg/dL (ref 6–20)
CO2: 25 mmol/L (ref 22–32)
Calcium: 9.1 mg/dL (ref 8.9–10.3)
Chloride: 108 mmol/L (ref 98–111)
Creatinine, Ser: 0.93 mg/dL (ref 0.61–1.24)
GFR, Estimated: >60 mL/min (ref >60)
Glucose, Bld: 124 mg/dL — ABNORMAL HIGH (ref 70–99)
Potassium: 4 mmol/L (ref 3.5–5.1)
Sodium: 138 mmol/L (ref 135–145)
Total Bilirubin: 0.5 mg/dL (ref 0.3–1.2)
Total Protein: 6.8 g/dL (ref 6.5–8.1)

## 2022-07-08 LAB — CBC
HCT: 37.5 % — ABNORMAL LOW (ref 39.0–52.0)
Hemoglobin: 13 g/dL (ref 13.0–17.0)
MCH: 25.8 pg — ABNORMAL LOW (ref 26.0–34.0)
MCHC: 34.7 g/dL (ref 30.0–36.0)
MCV: 74.6 fL — ABNORMAL LOW (ref 80.0–100.0)
Platelets: 283 10*3/uL (ref 150–400)
RBC: 5.03 MIL/uL (ref 4.22–5.81)
RDW: 14 % (ref 11.5–15.5)
WBC: 7.5 10*3/uL (ref 4.0–10.5)
nRBC: 0 % (ref 0.0–0.2)

## 2022-07-08 LAB — CULTURE, RESPIRATORY W GRAM STAIN

## 2022-07-08 LAB — PHOSPHORUS: Phosphorus: 3.3 mg/dL (ref 2.5–4.6)

## 2022-07-08 LAB — BILIRUBIN, DIRECT: Bilirubin, Direct: <0.1 mg/dL (ref 0.0–0.2)

## 2022-07-08 LAB — MAGNESIUM: Magnesium: 1.8 mg/dL (ref 1.7–2.4)

## 2022-07-08 MED ORDER — PERFLUTREN LIPID MICROSPHERE
1.0000 mL | INTRAVENOUS | Status: AC | PRN
  Administered 2022-07-08: 4 mL via INTRAVENOUS

## 2022-07-08 NOTE — Assessment & Plan Note (Signed)
-   Continue ELiquis

## 2022-07-08 NOTE — Progress Notes (Signed)
Physical Therapy Treatment and Discharge Patient Details Name: Jeffery Blackwell MRN: [id number] DOB: 1981/05/14 Today's Date: 07/08/2022   History of Present Illness Pt is a 41 y.o. male admitted 07/03/22 after found unresponsive in car, PEA cardiac arrest, drug OD, reports of decorticate posturing; UDS (+) opioids, cocaine, benzos. ETT 10/29-10/31. S/p bronchoscopy 10/30. EEG 10/30 suggestive of moderate diffuse encephalopathy; no seizures. MRI 10/31 Patchy restricted diffusion involving L > R globus palladi; nonspecific findings, differential considerations of CO poisoning, hypoxic ischemic encephalopathy, or changes related to drug abuse. PMH includes polysubstance use, asthma.    PT Comments    Excellent progress, meeting and exceeding therapy goals. Patient completed BERG and DGI tests indicating low fall risk. Safely navigating flight of steps. Only complaint is RUE pain - will request OT take a closer look next visit. No further acute PT indicated at this time. PT is signing-off, mobility techs to continue mobilizing patient during admission. All questions answered.   Patient discharged from PT services secondary to goals met and no further PT needs identified.    Independent with functional mobility including gait and stairs, no AD use.  Progress and discharge plan discussed with patient and/or caregiver: Patient/Caregiver agrees with plan     Recommendations for follow up therapy are one component of a multi-disciplinary discharge planning process, led by the attending physician.  Recommendations may be updated based on patient status, additional functional criteria and insurance authorization.  Follow Up Recommendations        Assistance Recommended at Discharge Intermittent Supervision/Assistance  Patient can return home with the following Assist for transportation;Direct supervision/assist for medications management;Direct supervision/assist for financial management   Equipment  Recommendations  None recommended by PT    Recommendations for Other Services       Precautions / Restrictions Restrictions Weight Bearing Restrictions: No     Mobility  Bed Mobility Overal bed mobility: Independent                  Transfers Overall transfer level: Independent                 General transfer comment: No asisst needed, steady upon rising, no AD    Ambulation/Gait Ambulation/Gait assistance: Independent Gait Distance (Feet): 275 Feet Assistive device: None Gait Pattern/deviations: Step-through pattern, Wide base of support   Gait velocity interpretation: 1.31 - 2.62 ft/sec, indicative of limited community ambulator   General Gait Details: Challenged with dynamic tasks, showing good control with evidence of LOB today. No AD   Stairs Stairs: Yes Stairs assistance: Modified independent (Device/Increase time) Stair Management: One rail Left, No rails Number of Stairs: 14 General stair comments: no rail going up, single rail on descent, step through pattern. no LOB, good control.   Wheelchair Mobility    Modified Rankin (Stroke Patients Only)       Balance                               High Level Balance Comments: See DGI and BERG completed Standardized Balance Assessment Standardized Balance Assessment : Berg Balance Test, Dynamic Gait Index Berg Balance Test Sit to Stand: Able to stand without using hands and stabilize independently Standing Unsupported: Able to stand safely 2 minutes Sitting with Back Unsupported but Feet Supported on Floor or Stool: Able to sit safely and securely 2 minutes Stand to Sit: Sits safely with minimal use of hands Transfers: Able to transfer safely, minor use  of hands Standing Unsupported with Eyes Closed: Able to stand 10 seconds safely Standing Ubsupported with Feet Together: Able to place feet together independently and stand 1 minute safely From Standing, Reach Forward with  Outstretched Arm: Can reach confidently >25 cm (10") From Standing Position, Pick up Object from Floor: Able to pick up shoe safely and easily From Standing Position, Turn to Look Behind Over each Shoulder: Looks behind from both sides and weight shifts well Turn 360 Degrees: Able to turn 360 degrees safely in 4 seconds or less Standing Unsupported, Alternately Place Feet on Step/Stool: Able to stand independently and safely and complete 8 steps in 20 seconds Standing Unsupported, One Foot in Front: Able to place foot tandem independently and hold 30 seconds Standing on One Leg: Tries to lift leg/unable to hold 3 seconds but remains standing independently (Can hold LLE >10 sec, RLE <3 sec) Total Score: 53 Dynamic Gait Index Level Surface: Normal Change in Gait Speed: Normal Gait with Horizontal Head Turns: Normal Gait with Vertical Head Turns: Normal Gait and Pivot Turn: Normal Step Over Obstacle: Mild Impairment Step Around Obstacles: Normal Steps: Normal Total Score: 23      Cognition Arousal/Alertness: Awake/alert Behavior During Therapy: Flat affect Overall Cognitive Status: Within Functional Limits for tasks assessed                                          Exercises      General Comments        Pertinent Vitals/Pain Pain Assessment Pain Assessment: Faces Faces Pain Scale: Hurts a little bit Pain Location: RUE Pain Descriptors / Indicators: Sore, Tightness Pain Intervention(s): Monitored during session    Home Living                          Prior Function            PT Goals (current goals can now be found in the care plan section) Acute Rehab PT Goals Patient Stated Goal: get some rest PT Goal Formulation: All assessment and education complete, DC therapy Time For Goal Achievement: 07/19/22 Potential to Achieve Goals: Good Progress towards PT goals: Goals met/education completed, patient discharged from PT    Frequency            PT Plan Discharge plan needs to be updated    Co-evaluation              AM-PAC PT "6 Clicks" Mobility   Outcome Measure  Help needed turning from your back to your side while in a flat bed without using bedrails?: None Help needed moving from lying on your back to sitting on the side of a flat bed without using bedrails?: None Help needed moving to and from a bed to a chair (including a wheelchair)?: None Help needed standing up from a chair using your arms (e.g., wheelchair or bedside chair)?: None Help needed to walk in hospital room?: None Help needed climbing 3-5 steps with a railing? : None 6 Click Score: 24    End of Session   Activity Tolerance: Patient tolerated treatment well Patient left: in bed;with call bell/phone within reach Nurse Communication: Mobility status PT Visit Diagnosis: Other abnormalities of gait and mobility (R26.89);Muscle weakness (generalized) (M62.81)     Time: 2671-2458 PT Time Calculation (min) (ACUTE ONLY): 11 min  Charges:  $Therapeutic Activity:  8-22 mins                     Candie Mile, PT, DPT Physical Therapist Acute [facility] [place] 07/08/2022, 4:09 PM

## 2022-07-08 NOTE — Progress Notes (Signed)
Mobility Specialist Progress Note:   07/08/22 1352  Mobility  Activity Ambulated independently in hallway  Level of Assistance Independent  Assistive Device None  Distance Ambulated (ft) 1000 ft  Activity Response Tolerated well  Mobility Referral Yes  $Mobility charge 1 Mobility   Pt received in bed and agreeable. No complaints. Pt left in chair with all needs met and call bell in reach.   Dezzie Badilla Mobility Specialist-Acute Rehab Secure Chat only

## 2022-07-08 NOTE — Discharge Instructions (Signed)
Information on my medicine - ELIQUIS (apixaban)  This medication education was reviewed with me or my healthcare representative as part of my discharge preparation.   Why was Eliquis prescribed for you? Eliquis was prescribed to treat blood clots that may have been found in the veins of your legs (deep vein thrombosis) or in your lungs (pulmonary embolism) and to reduce the risk of them occurring again.  What do You need to know about Eliquis ? The starting dose is 10 mg (two 5 mg tablets) taken TWICE daily for the FIRST SEVEN (7) DAYS, then on July 15, 2022  the dose is reduced to ONE 5 mg tablet taken TWICE daily.  Eliquis may be taken with or without food.   Try to take the dose about the same time in the morning and in the evening. If you have difficulty swallowing the tablet whole please discuss with your pharmacist how to take the medication safely.  Take Eliquis exactly as prescribed and DO NOT stop taking Eliquis without talking to the doctor who prescribed the medication.  Stopping may increase your risk of developing a new blood clot.  Refill your prescription before you run out.  After discharge, you should have regular check-up appointments with your healthcare provider that is prescribing your Eliquis.    What do you do if you miss a dose? If a dose of ELIQUIS is not taken at the scheduled time, take it as soon as possible on the same day and twice-daily administration should be resumed. The dose should not be doubled to make up for a missed dose.  Important Safety Information A possible side effect of Eliquis is bleeding. You should call your healthcare provider right away if you experience any of the following: Bleeding from an injury or your nose that does not stop. Unusual colored urine (red or dark brown) or unusual colored stools (red or black). Unusual bruising for unknown reasons. A serious fall or if you hit your head (even if there is no bleeding).  Some  medicines may interact with Eliquis and might increase your risk of bleeding or clotting while on Eliquis. To help avoid this, consult your healthcare provider or pharmacist prior to using any new prescription or non-prescription medications, including herbals, vitamins, non-steroidal anti-inflammatory drugs (NSAIDs) and supplements.  This website has more information on Eliquis (apixaban): http://[email or web address].com/eliquis/home

## 2022-07-08 NOTE — Progress Notes (Signed)
  Echocardiogram 2D Echocardiogram has been performed.  Jeffery Blackwell 07/08/2022, 12:19 PM

## 2022-07-08 NOTE — Progress Notes (Signed)
Progress Note   Patient: Jeffery Blackwell CVK:[id number] DOB: 10/23/1980 DOA: 07/03/2022     4 DOS: the patient was seen and examined on 07/08/2022 at 12:38PM      Brief hospital course: Mr. Spivak is a 41 y.o. M with morbid obesity, asthma, gonorrhea, polysubstance abuse who was found down.  Patient evidently observed by bystanders standing in a car for several hours, then noted to be unresponsive.  EMS found patient with agonal breathing, pinpoint pupils, Narcan given with some improvement in breathing.   In the ER, GCS 3, intubated.  UDS positive for cocaine, opiates, benzodiazepines.  CXR clear. Lactic acid 5.5.  Shortly after intubation, developed PEA arrest.  Later became hypoxic and had 2nd PEA arrest.     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    Remains hemodynamically stable and on room air - Follow echo, ordered yesterday     Acute respiratory failure with hypoxia and hypercapnia (HCC) Due to drug overdose and aspiration pneumonia.  CT chest showed complete right upper lobe collapse and subtotal bilateral lower lobe collapse with extensive airway impaction in the right upper lobe and right lower lobe.  S/p bronch 10/30 with BAL, rare MSSA  Now weaned to room air - Continue antibiotics - Aggressive pumonary toilet    Aspiration pneumonia ([place]) Improving on Zosyn.  BAL with rare Staph aureus, MSSA - Continue cefdinir and Flagyl    Acute deep vein thrombosis (DVT) of brachial vein of left upper extremity (HCC) - Continue ELiquis   Lip swelling No rash or itching to suggest angioedema.  Has been on Zosyn for 3 days without  progression or airway compromise.  Suspect this is from ETT trauma.    Seeems better today, I suspect ETT trauma - Monitor       Obesity, Class III, BMI 40-49.9 (morbid obesity) (HCC) BMI 40.6  Shock liver IMproved, LFTs stil up - Follow up with PCP, if still elevated, [place] and hepatitis serologies           Subjective: Patient still has some leg swelling but improving.  He has a little bit of coughing, no more wheezing, no dyspnea with exertion.  No further fever.  No confusion.  No rashes or itching.  No dyspnea, no airway stridor or difficulty breathing.  No chest pain.     Physical Exam: BP 133/74 (BP Location: Left Arm)   Pulse 69   Temp 98.1 F (36.7 C) (Oral)   Resp 18   Ht 6' (1.829 m)   Wt 136 kg   SpO2 98%   BMI 40.66 kg/m   Obese adult male, sitting up in recliner, no acute distress RRR, no murmurs, no peripheral edema, no JVD Respiratory rate normal, lungs clear, good air movement on both sides, no wheezing appreciated Attention normal, affect appropriate, judgment and insight appear normal Upper lip is still swollen, seems to be improving on the right side, still swollen on the left, is nontender, not edematous, does not have the signs of angioedema or infection.  I suspect this is trauma from his ETT    Data Reviewed: Ultrasound of the left upper extremity showed DVT in the brachial  veins Magnesium and phosphorus normal CBC unremarkable except some microcytosis LFTs still slightly elevated 75/63 Creatinine, potassium, and sodium normal    Family Communication: None present    Disposition: Status is: Inpatient The patient was admitted with cardiac arrest, respiratory failure, and aspiration pneumonia  Most of these are resolved, we are still waiting on his echocardiogram, if this is normal I will likely plan for discharge tomorrow with new PCP follow-up        Author: Edwin Dada, MD 07/08/2022 1:58 PM  For on call  review [email or web address].

## 2022-07-08 NOTE — Plan of Care (Signed)
0700:Pt in bathroom at beginning of shift, pt ambulatory, pt alert and oriented x4,pt on room air, pt call bell within reach and bed in lowest position.

## 2022-07-09 ENCOUNTER — Other Ambulatory Visit (HOSPITAL_COMMUNITY): Payer: Self-pay

## 2022-07-09 LAB — PHOSPHORUS: Phosphorus: 3.5 mg/dL (ref 2.5–4.6)

## 2022-07-09 LAB — BASIC METABOLIC PANEL
Anion gap: 12 (ref 5–15)
BUN: 9 mg/dL (ref 6–20)
CO2: 22 mmol/L (ref 22–32)
Calcium: 9.7 mg/dL (ref 8.9–10.3)
Chloride: 105 mmol/L (ref 98–111)
Creatinine, Ser: 0.87 mg/dL (ref 0.61–1.24)
GFR, Estimated: >60 mL/min (ref >60)
Glucose, Bld: 132 mg/dL — ABNORMAL HIGH (ref 70–99)
Potassium: 4.1 mmol/L (ref 3.5–5.1)
Sodium: 139 mmol/L (ref 135–145)

## 2022-07-09 LAB — CULTURE, BLOOD (ROUTINE X 2)
Culture: NO GROWTH
Special Requests: ADEQUATE

## 2022-07-09 LAB — MAGNESIUM: Magnesium: 1.9 mg/dL (ref 1.7–2.4)

## 2022-07-09 LAB — HEPATIC FUNCTION PANEL
ALT: 114 U/L — ABNORMAL HIGH (ref 0–44)
AST: 122 U/L — ABNORMAL HIGH (ref 15–41)
Albumin: 3.4 g/dL — ABNORMAL LOW (ref 3.5–5.0)
Alkaline Phosphatase: 91 U/L (ref 38–126)
Bilirubin, Direct: <0.1 mg/dL (ref 0.0–0.2)
Total Bilirubin: 0.6 mg/dL (ref 0.3–1.2)
Total Protein: 7.1 g/dL (ref 6.5–8.1)

## 2022-07-09 LAB — CBC
HCT: 40.1 % (ref 39.0–52.0)
Hemoglobin: 14 g/dL (ref 13.0–17.0)
MCH: 25.9 pg — ABNORMAL LOW (ref 26.0–34.0)
MCHC: 34.9 g/dL (ref 30.0–36.0)
MCV: 74.3 fL — ABNORMAL LOW (ref 80.0–100.0)
Platelets: 328 10*3/uL (ref 150–400)
RBC: 5.4 MIL/uL (ref 4.22–5.81)
RDW: 14.1 % (ref 11.5–15.5)
WBC: 10.4 10*3/uL (ref 4.0–10.5)
nRBC: 0 % (ref 0.0–0.2)

## 2022-07-09 MED ORDER — ATORVASTATIN CALCIUM 40 MG PO TABS
40.0000 mg | ORAL_TABLET | Freq: Every day | ORAL | 0 refills | Status: DC
  Filled 2022-07-09: qty 30, 30d supply, fill #0

## 2022-07-09 MED ORDER — APIXABAN 5 MG PO TABS
ORAL_TABLET | ORAL | 2 refills | Status: DC
  Filled 2022-07-09: qty 40, fill #0

## 2022-07-09 MED ORDER — APIXABAN (ELIQUIS) VTE STARTER PACK (10MG AND 5MG)
ORAL_TABLET | ORAL | 0 refills | Status: DC
  Filled 2022-07-09: qty 74, 30d supply, fill #0

## 2022-07-09 MED ORDER — CEFADROXIL 500 MG PO CAPS
1.0000 g | ORAL_CAPSULE | Freq: Two times a day (BID) | ORAL | 0 refills | Status: AC
  Filled 2022-07-09: qty 8, 2d supply, fill #0

## 2022-07-09 NOTE — Discharge Summary (Signed)
Physician Discharge Summary   Patient: Jeffery Blackwell MRN: [id number] DOB: 1981/02/19  Admit date:     07/03/2022  Discharge date: 07/09/22  Discharge Physician: Jeffery Blackwell   PCP: Patient, No Pcp Per     Recommendations at discharge:  Follow up with new PCP Jeffery Blackwell in 3 weeks Jeffery Blackwell:  Please repeat LFTs in 3 weeks and stop statin if >3x ULN as well as check hepatitis serologies; if LFTs remain elevated on repeat after that, please obtain US liver as indicated Please re-prescibe Eliquis for left arm DVT, therapy to complete 3 months until early Feb 2024     Discharge Diagnoses: Principal Problem:   PEA cardiac arrest Active Problems:   Acute respiratory failure with hypoxia and hypercapnia (HCC)   Aspiration pneumonia ([place])   AKI (acute kidney injury) ([place])   Shock ([place])   Hypoxic ischemic encephalopathy (HIE)   Acute metabolic encephalopathy   Hypokalemia   Hypomagnesemia   Hyperkalemia   Shock liver   Obesity, Class III, BMI 40-49.9 (morbid obesity) ([place])   Demand ischemia   Polysubstance abuse (HCC)   Lip swelling   Acute deep vein thrombosis (DVT) of brachial vein of left upper extremity [facility])      Hospital Course: Jeffery Blackwell is a 41 y.o. M with morbid obesity, asthma, gonorrhea, polysubstance abuse who was found down.  Patient evidently observed by bystanders standing in a car for several hours, then noted to be unresponsive.  EMS found patient with agonal breathing, pinpoint pupils, Narcan given with some improvement in breathing.   In the ER, GCS 3, intubated.  UDS positive for cocaine, opiates, benzodiazepines.  CXR clear. Lactic acid 5.5.  Shortly after intubation, developed PEA arrest.  Later became hypoxic and had 2nd PEA arrest.     10/29: Admitted, intubated, suffered 2 PEA arrests, CXR clear 10/30: Bronch showed thick tenacious secretions throughout, suction and BAL performed in the right upper lobe  10/31: Extubated, MRI  shows HIE 11/2: Transferred OOU 11/2: US shows DVT in LUE, Eliquis started    * PEA cardiac arrest Due to hypoxia from drug overdose and aspiration pneumonia.    After exubation he had a seemingly full neurological recovery and remained hemodynamically stable and on room air.    Echocardiogram obtained, showed normal EF, no RWMA or valve disease.   Acute respiratory failure with hypoxia and hypercapnia (HCC) Due to drug overdose and aspiration pneumonia.  CT chest shortly after admission showed complete right upper lobe collapse and subtotal bilateral lower lobe collapse with extensive airway impaction in the right upper lobe and right lower lobe.  Underwent bronchoscopy on 10/30 with BAL, which rrew MSSA.  Completed 5 days antibiotics in hospital, temp < 100 F, heart rate < 100bpm, RR < 24, SpO2 at baseline.   Discharged to complete total 7 days Abx with cefadroxil   Acute deep vein thrombosis (DVT) of brachial vein of left upper extremity ([place]) Noted to have arm swelling.  [place] arm showed LUE DVT.  Started on Eliquis.  Plan for 3 months for first time provoked DVT then stop.   Lip swelling This presented after extubation.  No clear medication trigger, did not appear to be infection or angioedema.  Suspect ETT trauma.   Improving at time of discharge.  Polysubstance abuse ([place]) UDS positive for opiates, benzodiazepins and cocaine at admission  Demand ischemia Mild troponin leak in setting of septic shock and PEA arrest.  Obesity, Class III, BMI 40-49.9 (morbid obesity) (  [place]) BMI 40.6  Shock liver Transaminitis - Follow up with PCP, if still elevated, [place] and hepatitis serologies and stop statin  Hypoxic ischemic encephalopathy (HIE) MRI brain showed patchy restricted diffusion in bilateral BG.  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  Stroke ruled out.  No apparent neurological deficits at discharge.               The [facility] Controlled Substances Registry was reviewed for this patient prior to discharge.  Consultants: Critical Care Procedures performed:  - Endotracheal intubation - Central venous catheter placement - Bronchoscopy - EEG - Echocardiogram - MRI brain - CT angiography of chest abdomen and pelvis - US left upper extremity     Disposition: Home   DISCHARGE MEDICATION: Allergies as of 07/09/2022       Reactions   Ibuprofen Swelling        Medication List     TAKE these medications    albuterol 108 (90 Base) MCG/ACT inhaler Commonly known as: VENTOLIN HFA Inhale 1-2 puffs into the lungs every 6 (six) hours as needed for wheezing or shortness of breath. What changed: when to take this   atorvastatin 40 MG tablet Commonly known as: LIPITOR Place 1 tablet (40 mg total) into feeding tube daily. Start taking on: July 10, 2022   cefadroxil 500 MG capsule Commonly known as: DURICEF Take 2 capsules (1,000 mg total) by mouth 2 (two) times daily for 2 days.   Eliquis DVT/PE Starter Pack Generic drug: Apixaban Starter Pack (54m and 576m Take as directed on package: start with two-36m38mablets twice daily for 7 days. On day 8, switch to one-36mg17mblet twice daily.   multivitamin with minerals tablet Take 1 tablet by mouth daily.        Follow-up Information     EdwaKerin Perna Follow up on 08/03/2022.   Specialty: Internal Medicine Why: You are scheduled for a hospital follow up on Wednesday, November, 29, 2023 at 2:10 pm Contact information: [address] [place] [phone]     [facility] Call.   Specialty: Rehabilitation Why: Please call and arrange appointment to have follow up Outpatient physical rehabilitation services started. Contact information: [phone]            Discharge Instructions      Ambulatory referral to Physical Therapy   Complete by: As directed    Iontophoresis - 4 mg/ml of dexamethasone: No   T.E.N.S. Unit Evaluation and Dispense as Indicated: No   Discharge instructions   Complete by: As directed    **IMPORTANT DISCHARGE INSTRUCTIONS**    From Dr. DanfLoleta Booksu were admitted with cardiac arrest You had overdosed and aspirated a large amount of vomit into your lungs causing your heart and lungs to stop Thankfully, EMS were able to restart your heart, and your body recovered   You were treated here for infection/pneumonia You were also found to have blood clots in your arms and started on a blood thinner   For the pneumonia: Take cefadroxil 1g twice daily for 4 more days, starting today If you have fever that comes back after you stop the antibiotics, or if you have trouble breathing, come back to the ER    For the blood clots: Take a blood thinner for 3 months Take apixaban/Eliquis 10 mg (two tabs) twice daily for 5  more days to complete the loading dose This Thursday night, reduce the dose to 5 mg (1 tab) twice daily after that  Take the apixaban/Eliquis 5 mg twice daily then until Feb 2 and then stop, if approved by your new primary care, Jeffery Blackwell   Go see Jeffery Blackwell in 3 weeks, as scheduled on Nov 29th, see below They may call you to move this appointment up.  Do not miss the appointment Make sure they check your liver function at that appointment  You were found to have high cholesterol, please start the cholesterol medicine atorvastatin 40 mg nightly from now on   Increase activity slowly   Complete by: As directed        Discharge Exam: Filed Weights   07/06/22 0500 07/06/22 1319 07/07/22 0403  Weight: 133.2 kg (!) 136.7 kg 136 kg    General: Pt is alert, awake, not in acute distress Cardiovascular: RRR, nl S1-S2, no murmurs appreciated.   No LE edema.   HEENT: The upper lip, more on the left, is swollen,  overall improving, no redness, induration.  Tongue normal, voice normal. Respiratory: Normal respiratory rate and rhythm.  CTAB without rales or wheezes. MSK: Some ropy induration of bicep on right, no redness, no fluctuance, feels like superficial thrombophlebitis. Abdominal: Abdomen soft and non-tender.  No distension or HSM.   Neuro/Psych: Strength symmetric in upper and lower extremities.  Judgment and insight appear normal.   Condition at discharge: good  The results of significant diagnostics from this hospitalization (including imaging, microbiology, ancillary and laboratory) are listed below for reference.   Imaging Studies: VAS [place] UPPER EXTREMITY VENOUS DUPLEX  Result Date: 07/08/2022 UPPER VENOUS STUDY  Patient Name:  ARISTIDIS TALERICO  Date of Exam:   07/07/2022 Medical Rec #: [id number]       Accession #:    [id number] Date of Birth: 23-May-1981       Patient Gender: M Patient Age:   26 years Exam Location:  [facility] Procedure:      VAS [place] UPPER EXTREMITY VENOUS DUPLEX Referring Phys: Harrell Gave Caterin Tabares --------------------------------------------------------------------------------  Indications: Pain, and Swelling Other Indications: IV to LUE. Comparison Study: No previous exams Performing Technologist: Jody Hill RVT, RDMS  Examination Guidelines: A complete evaluation includes B-mode imaging, spectral Doppler, color Doppler, and power Doppler as needed of all accessible portions of each vessel. Bilateral testing is considered an integral part of a complete examination. Limited examinations for reoccurring indications may be performed as noted.  Right Findings: +----------+------------+---------+-----------+----------+-------+ RIGHT     CompressiblePhasicitySpontaneousPropertiesSummary +----------+------------+---------+-----------+----------+-------+ Subclavian    Full       Yes       Yes                       +----------+------------+---------+-----------+----------+-------+  Left Findings: +----------+------------+---------+-----------+----------+--------------------+ LEFT      CompressiblePhasicitySpontaneousProperties      Summary        +----------+------------+---------+-----------+----------+--------------------+ IJV           Full       Yes       Yes                                   +----------+------------+---------+-----------+----------+--------------------+ Subclavian    Full       Yes       Yes                                   +----------+------------+---------+-----------+----------+--------------------+  Axillary      Full       Yes       Yes                                   +----------+------------+---------+-----------+----------+--------------------+ Brachial      Full       No        No               Acute- one of paired +----------+------------+---------+-----------+----------+--------------------+ Radial        Full                                                       +----------+------------+---------+-----------+----------+--------------------+ Ulnar         Full                                                       +----------+------------+---------+-----------+----------+--------------------+ Cephalic      Full                                                       +----------+------------+---------+-----------+----------+--------------------+ Basilic     Partial      Yes       Yes                     Acute         +----------+------------+---------+-----------+----------+--------------------+  Summary:  Right: No evidence of thrombosis in the subclavian.  Left: Findings consistent with acute deep vein thrombosis involving the one of the left brachial veins. Findings consistent with acute superficial vein thrombosis involving the left basilic vein.  *See table(s) above for measurements and observations.  Diagnosing physician: Orlie Pollen Electronically signed by Orlie Pollen on 07/08/2022 at 6:47:32 PM.    Final    ECHOCARDIOGRAM COMPLETE  Result Date: 07/08/2022    ECHOCARDIOGRAM REPORT   Patient Name:   Jhamal Plucinski Date of Exam: 07/08/2022 Medical Rec #:  [id number]      Height:       72.0 in Accession #:    [id number]     Weight:       299.8 lb Date of Birth:  [birth year]      BSA:          2.531 m Patient Age:    39 years       BP:           133/74 mmHg Patient Gender: M              HR:           80 bpm. Exam Location:  Inpatient Procedure: 2D Echo, Color Doppler, Cardiac Doppler and Intracardiac            Opacification Agent Indications:    Cardiac arrest I46.9  History:        Patient has no prior history of Echocardiogram examinations.  Cardiac arrest.  Sonographer:    Greer Pickerel Referring Phys: Suann Larry Harini Dearmond  Sonographer Comments: Patient is obese, suboptimal subcostal window, suboptimal parasternal window, Technically difficult study due to poor echo windows and suboptimal apical window. Image acquisition challenging due to patient body habitus and Image acquisition challenging due to respiratory motion. IMPRESSIONS  1. Left ventricular ejection fraction, by estimation, is 65 to 70%. The left ventricle has normal function. The left ventricle has no regional wall motion abnormalities. There is mild concentric left ventricular hypertrophy. Left ventricular diastolic parameters are consistent with Grade I diastolic dysfunction (impaired relaxation).  2. Right ventricular systolic function is normal. The right ventricular size is normal.  3. The mitral valve is normal in structure. No evidence of mitral valve regurgitation. No evidence of mitral stenosis.  4. The aortic valve is tricuspid. Aortic valve regurgitation is not visualized. No aortic stenosis is present.  5. The inferior vena cava is normal in size with greater than 50% respiratory variability, suggesting right atrial pressure of 3 mmHg.  Comparison(s): No prior Echocardiogram. FINDINGS  Left Ventricle: Left ventricular ejection fraction, by estimation, is 65 to 70%. The left ventricle has normal function. The left ventricle has no regional wall motion abnormalities. Definity contrast agent was given IV to delineate the left ventricular  endocardial borders. The left ventricular internal cavity size was normal in size. There is mild concentric left ventricular hypertrophy. Left ventricular diastolic parameters are consistent with Grade I diastolic dysfunction (impaired relaxation). Right Ventricle: The right ventricular size is normal. Right ventricular systolic function is normal. Left Atrium: Left atrial size was normal in size. Right Atrium: Right atrial size was normal in size. Pericardium: Trivial pericardial effusion is present. Mitral Valve: The mitral valve is normal in structure. No evidence of mitral valve regurgitation. No evidence of mitral valve stenosis. Tricuspid Valve: The tricuspid valve is normal in structure. Tricuspid valve regurgitation is trivial. No evidence of tricuspid stenosis. Aortic Valve: The aortic valve is tricuspid. Aortic valve regurgitation is not visualized. No aortic stenosis is present. Pulmonic Valve: The pulmonic valve was normal in structure. Pulmonic valve regurgitation is not visualized. No evidence of pulmonic stenosis. Aorta: The aortic root is normal in size and structure. Venous: The inferior vena cava is normal in size with greater than 50% respiratory variability, suggesting right atrial pressure of 3 mmHg. IAS/Shunts: No atrial level shunt detected by color flow Doppler.  LEFT VENTRICLE PLAX 2D LVIDd:         3.60 cm   Diastology LVIDs:         2.30 cm   LV e' medial:    7.18 cm/s LV PW:         0.90 cm   LV E/e' medial:  10.2 LV IVS:        1.30 cm   LV e' lateral:   9.14 cm/s LVOT diam:     2.10 cm   LV E/e' lateral: 8.0 LV SV:         66 LV SV Index:   26 LVOT Area:     3.46 cm  RIGHT VENTRICLE RV  S prime:     16.30 cm/s TAPSE (M-mode): 2.3 cm LEFT ATRIUM             Index        RIGHT ATRIUM           Index LA diam:        2.90 cm 1.15 cm/m   RA Area:  18.40 cm LA Vol (A2C):   35.5 ml 14.03 ml/m  RA Volume:   52.60 ml  20.79 ml/m LA Vol (A4C):   34.5 ml 13.63 ml/m LA Biplane Vol: 36.0 ml 14.23 ml/m  AORTIC VALVE LVOT Vmax:   107.00 cm/s LVOT Vmean:  70.700 cm/s LVOT VTI:    0.190 m  AORTA Ao Root diam: 3.60 cm Ao Asc diam:  2.90 cm MITRAL VALVE MV Area (PHT): 3.54 cm    SHUNTS MV Decel Time: 214 msec    Systemic VTI:  0.19 m MV E velocity: 73.40 cm/s  Systemic Diam: 2.10 cm MV A velocity: 71.30 cm/s MV E/A ratio:  1.03 Kirk Ruths MD Electronically signed by Kirk Ruths MD Signature Date/Time: 07/08/2022/1:59:20 PM    Final    MR BRAIN WO CONTRAST  Result Date: 07/05/2022 CLINICAL DATA:  Initial evaluation for mental status change, unknown cause. EXAM: MRI HEAD WITHOUT CONTRAST TECHNIQUE: Multiplanar, multiecho pulse sequences of the brain and surrounding structures were obtained without intravenous contrast. COMPARISON:  Prior CT from 07/04/2022. FINDINGS: Brain: Cerebral volume within normal limits. Patchy restricted diffusion seen involving the left greater than right globus palladi (series 5, image 81). Associated increased T2/FLAIR signal intensity within these areas without significant regional mass effect. Probable minimal associated petechial blood products. No other focal parenchymal signal abnormality. No other evidence for acute or subacute ischemia. No areas of chronic cortical infarction. No other acute or chronic intracranial blood products. No mass lesion, midline shift or mass effect. No hydrocephalus or extra-axial fluid collection. Pituitary gland and suprasellar region within normal limits. Vascular: Major intracranial vascular flow voids are maintained. Skull and upper cervical spine: Decreased T1 hyperintensity seen throughout the bone marrow the visualized upper  cervical spine, nonspecific, but most commonly related to anemia, smoking, or obesity. No focal marrow replacing lesion. No scalp soft tissue abnormality. Sinuses/Orbits: Globes and orbital soft tissues within normal limits. Extensive mucosal thickening with air-fluid levels present throughout the paranasal sinuses. Left greater than right mastoid effusions. Patient is intubated. Other: None. IMPRESSION: 1. Patchy restricted diffusion involving the left greater than right globus palladi. Findings are nonspecific, with primary differential considerations including carbon monoxide poisoning, hypoxic ischemic encephalopathy, or changes related to drug abuse. Subtle associated petechial blood products without frank hemorrhagic transformation or significant mass effect. 2. Otherwise normal brain MRI. Electronically Signed   By: Jeannine Boga M.D.   On: 07/05/2022 01:39   EEG adult  Result Date: 07/04/2022 Lora Havens, MD     07/04/2022  1:55 PM Patient Name: Avi Kerschner MRN: [id number] Epilepsy Attending: Lora Havens Referring Physician/Provider: Estill Cotta, NP Date: 07/04/2022 Duration: 22.36 mins Patient history: 41yo M with ams. EEG to evaluate for seizure. Level of alertness: lethargic AEDs during EEG study: None Technical aspects: This EEG study was done with scalp electrodes positioned according to the 10-20 International system of electrode placement. Electrical activity was reviewed with band pass filter of 1-_0 , sensitivity of 7 uV/mm, display speed of 16m/sec with a _1  notched filter applied as appropriate. EEG data were recorded continuously and digitally stored.  Video monitoring was available and reviewed as appropriate. Description: EEG showed continuous generalized predominantly 5 to 7 Hz theta slowing admixed with intermittent generalized 2-_2  delta slowing. Hyperventilation and photic stimulation were not performed.   ABNORMALITY - Continuous slow, generalized  IMPRESSION: This study is suggestive of moderate diffuse encephalopathy, nonspecific etiology. No seizures or epileptiform discharges were seen throughout the recording. Priyanka OBarbra Sarks  DG CHEST PORT 1 VIEW  Result Date: 07/04/2022 CLINICAL DATA:  41 year old male central line placement.  Intubated. EXAM: PORTABLE CHEST 1 VIEW COMPARISON:  CT Chest, Abdomen, and Pelvis 0040 hours today and earlier. FINDINGS: Portable AP supine view at 0531 hours. New right IJ approach central line, tip projects at the level of the carina, lower SVC. Endotracheal tube position appears stable. Enteric tube partially visible looping in the stomach. No pneumothorax. Stable cardiac size and mediastinal contours. Improved but not fully resolved right upper lobe collapse since the CT earlier today. Residual platelike atelectasis at the right hilum. No pulmonary edema or pleural effusion identified. Stable visualized osseous structures. IMPRESSION: 1. New right IJ central line placed, tip at the lower SVC level. No pneumothorax. 2. Otherwise stable lines and tubes. 3. Improved but not fully resolved right upper lobe collapse since the CT earlier today. Electronically Signed   By: Genevie Ann M.D.   On: 07/04/2022 06:59   CT Head Wo Contrast  Result Date: 07/04/2022 CLINICAL DATA:  Neck trauma, intoxicated or obtunded (Age >= 16y); Mental status change, unknown cause EXAM: CT HEAD WITHOUT CONTRAST CT CERVICAL SPINE WITHOUT CONTRAST TECHNIQUE: Multidetector CT imaging of the head and cervical spine was performed following the standard protocol without intravenous contrast. Multiplanar CT image reconstructions of the cervical spine were also generated. RADIATION DOSE REDUCTION: This exam was performed according to the departmental dose-optimization program which includes automated exposure control, adjustment of the mA and/or kV according to patient size and/or use of iterative reconstruction technique. COMPARISON:  None Available.  FINDINGS: CT HEAD FINDINGS Brain: There is an age-indeterminate lacunar infarct within the left globus pallidus. No abnormal intra or extra-axial mass lesion or fluid collection. No abnormal mass effect or midline shift. No acute intracranial hemorrhage. Ventricular size is normal. Cerebellum is unremarkable. Vascular: No hyperdense vessel or unexpected calcification. Skull: Normal. Negative for fracture or focal lesion. Sinuses/Orbits: No acute finding. Other: Mastoid air cells and middle ear cavities are clear. CT CERVICAL SPINE FINDINGS Alignment: Normal. Skull base and vertebrae: No acute fracture. No primary bone lesion or focal pathologic process. Soft tissues and spinal canal: No prevertebral fluid or swelling. No visible canal hematoma. Disc levels: Intervertebral disc heights are preserved. Prevertebral soft tissues are not thickened on sagittal reformats. Spinal canal is widely patent. No significant neuroforaminal narrowing. Upper chest: Right upper lobe collapse is partially visualized. Other: None IMPRESSION: 1. Age-indeterminate lacunar infarct within the left globus pallidus. If indicated, this would be better assessed with MRI examination. 2. No acute fracture or listhesis of the cervical spine. 3. Right upper lobe collapse, partially visualized. Electronically Signed   By: Fidela Salisbury M.D.   On: 07/04/2022 00:56   CT Cervical Spine Wo Contrast  Result Date: 07/04/2022 CLINICAL DATA:  Neck trauma, intoxicated or obtunded (Age >= 16y); Mental status change, unknown cause EXAM: CT HEAD WITHOUT CONTRAST CT CERVICAL SPINE WITHOUT CONTRAST TECHNIQUE: Multidetector CT imaging of the head and cervical spine was performed following the standard protocol without intravenous contrast. Multiplanar CT image reconstructions of the cervical spine were also generated. RADIATION DOSE REDUCTION: This exam was performed according to the departmental dose-optimization program which includes automated exposure  control, adjustment of the mA and/or kV according to patient size and/or use of iterative reconstruction technique. COMPARISON:  None Available. FINDINGS: CT HEAD FINDINGS Brain: There is an age-indeterminate lacunar infarct within the left globus pallidus. No abnormal intra or extra-axial mass lesion or fluid collection. No abnormal mass effect  or midline shift. No acute intracranial hemorrhage. Ventricular size is normal. Cerebellum is unremarkable. Vascular: No hyperdense vessel or unexpected calcification. Skull: Normal. Negative for fracture or focal lesion. Sinuses/Orbits: No acute finding. Other: Mastoid air cells and middle ear cavities are clear. CT CERVICAL SPINE FINDINGS Alignment: Normal. Skull base and vertebrae: No acute fracture. No primary bone lesion or focal pathologic process. Soft tissues and spinal canal: No prevertebral fluid or swelling. No visible canal hematoma. Disc levels: Intervertebral disc heights are preserved. Prevertebral soft tissues are not thickened on sagittal reformats. Spinal canal is widely patent. No significant neuroforaminal narrowing. Upper chest: Right upper lobe collapse is partially visualized. Other: None IMPRESSION: 1. Age-indeterminate lacunar infarct within the left globus pallidus. If indicated, this would be better assessed with MRI examination. 2. No acute fracture or listhesis of the cervical spine. 3. Right upper lobe collapse, partially visualized. Electronically Signed   By: Fidela Salisbury M.D.   On: 07/04/2022 00:56   CT Angio Chest/Abd/Pel for Dissection W and/or Wo Contrast  Result Date: 07/04/2022 CLINICAL DATA:  Acute aortic syndrome (AAS) suspected EXAM: CT ANGIOGRAPHY CHEST, ABDOMEN AND PELVIS TECHNIQUE: Non-contrast CT of the chest was initially obtained. Multidetector CT imaging through the chest, abdomen and pelvis was performed using the standard protocol during bolus administration of intravenous contrast. Multiplanar reconstructed images and  MIPs were obtained and reviewed to evaluate the vascular anatomy. RADIATION DOSE REDUCTION: This exam was performed according to the departmental dose-optimization program which includes automated exposure control, adjustment of the mA and/or kV according to patient size and/or use of iterative reconstruction technique. CONTRAST:  182m OMNIPAQUE IOHEXOL 350 MG/ML SOLN COMPARISON:  None Available. FINDINGS: CTA CHEST FINDINGS Cardiovascular: Thoracic aorta is normal in course and caliber. No intramural hematoma, dissection, or aneurysm. Arch vasculature is widely patent proximally. No significant coronary artery calcification. Global cardiac size within normal limits. No pericardial effusion. Central pulmonary arteries are of normal caliber. Mediastinum/Nodes: Endotracheal tube is seen 2.4 cm above the carina. Nasogastric tube extends into the distal body of the stomach. Visualized thyroid is unremarkable. No pathologic mediastinal adenopathy. Esophagus unremarkable. Lungs/Pleura: There is complete right upper lobe collapse as well as subtotal collapse of the lower lobes bilaterally, right greater than left. Extensive airway impaction noted within the right upper lobe and right lower lobe. No pneumothorax or pleural effusion. Musculoskeletal: No chest wall abnormality. No acute or significant osseous findings. Review of the MIP images confirms the above findings. CTA ABDOMEN AND PELVIS FINDINGS VASCULAR Aorta: Normal caliber aorta without aneurysm, dissection, vasculitis or significant stenosis. Celiac: Patent without evidence of aneurysm, dissection, vasculitis or significant stenosis. SMA: Patent without evidence of aneurysm, dissection, vasculitis or significant stenosis. Renals: Both renal arteries are patent without evidence of aneurysm, dissection, vasculitis, fibromuscular dysplasia or significant stenosis. IMA: Patent without evidence of aneurysm, dissection, vasculitis or significant stenosis. Inflow:  Patent without evidence of aneurysm, dissection, vasculitis or significant stenosis. Veins: No obvious venous abnormality within the limitations of this arterial phase study. Review of the MIP images confirms the above findings. NON-VASCULAR Hepatobiliary: No focal liver abnormality is seen. No gallstones, gallbladder wall thickening, or biliary dilatation. Pancreas: Unremarkable Spleen: Unremarkable Adrenals/Urinary Tract: The adrenal glands are unremarkable. The kidneys are normal. Foley catheter balloon seen within a decompressed bladder lumen. Stomach/Bowel: Mild sigmoid diverticulosis. Stomach, small bowel, and large bowel are otherwise unremarkable. Appendix normal. No free intraperitoneal gas or fluid. Lymphatic: No pathologic adenopathy within the abdomen and pelvis. Reproductive: Prostate is unremarkable. Other: No abdominal  wall hernia or abnormality. No abdominopelvic ascites. Musculoskeletal: No acute or significant osseous findings. Review of the MIP images confirms the above findings. IMPRESSION: 1. No evidence of thoracoabdominal aortic aneurysm or dissection. 2. Complete right upper lobe collapse as well as subtotal collapse of the lower lobes bilaterally, right greater than left. Extensive airway impaction within the right upper lobe and right lower lobe. 3. Mild sigmoid diverticulosis without superimposed acute inflammatory change. Electronically Signed   By: Fidela Salisbury M.D.   On: 07/04/2022 00:50   DG Chest Port 1 View  Result Date: 07/03/2022 CLINICAL DATA:  Respiratory failure EXAM: PORTABLE CHEST 1 VIEW COMPARISON:  09/16/2020 FINDINGS: Endotracheal tube is seen 17 mm above the carina. The retaining cuff of the endotracheal tube appears hyperinflated and appears to expand the airway. Nasogastric tube is looped within the gastric fundus. Lung volumes are small. There is focal opacity within the right apex which may represent an extrapleural mass such or fluid collection such as a  hematoma, and apically loculated pleural effusion, or an apical pulmonary mass. Lungs are otherwise clear. No pneumothorax or dependent pleural effusion. Cardiac size within normal limits. IMPRESSION: 1. Endotracheal tube 17 mm above the carina. The retaining cuff of the endotracheal tube appears hyperinflated and appears to expand the airway. 2. Pulmonary hypoinflation. 3. Focal opacity within the right apex which may represent an extrapleural mass such as a hematoma, and apically loculated pleural effusion, or an apical pulmonary mass. This would be better assessed with contrast enhanced CT imaging. Electronically Signed   By: Fidela Salisbury M.D.   On: 07/03/2022 22:52    Microbiology: Results for orders placed or performed during the hospital encounter of 07/03/22  Blood Culture (routine x 2)     Status: Abnormal   Collection Time: 07/03/22 10:50 PM   Specimen: BLOOD  Result Value Ref Range Status   Specimen Description BLOOD LEFT ANTECUBITAL  Final   Special Requests   Final    BOTTLES DRAWN AEROBIC AND ANAEROBIC Blood Culture adequate volume   Culture  Setup Time   Final    GRAM POSITIVE COCCI IN CHAINS AEROBIC BOTTLE ONLY CRITICAL RESULT CALLED TO, READ BACK BY AND VERIFIED WITH: PHARMD T. RUDISILL [id number] FH    Culture (A)  Final    STREPTOCOCCUS MITIS/ORALIS THE SIGNIFICANCE OF ISOLATING THIS ORGANISM FROM A SINGLE VENIPUNCTURE CANNOT BE PREDICTED WITHOUT FURTHER CLINICAL AND CULTURE CORRELATION. SUSCEPTIBILITIES AVAILABLE ONLY ON REQUEST. Performed at [facility], [place] [address], [place], [place] [address]    Report Status 07/05/2022 FINAL  Final  Blood Culture ID Panel (Reflexed)     Status: Abnormal   Collection Time: 07/03/22 10:50 PM  Result Value Ref Range Status   Enterococcus faecalis NOT DETECTED NOT DETECTED Final   Enterococcus Faecium NOT DETECTED NOT DETECTED Final   Listeria monocytogenes NOT DETECTED NOT DETECTED Final   Staphylococcus species NOT  DETECTED NOT DETECTED Final   Staphylococcus aureus (BCID) NOT DETECTED NOT DETECTED Final   Staphylococcus epidermidis NOT DETECTED NOT DETECTED Final   Staphylococcus lugdunensis NOT DETECTED NOT DETECTED Final   Streptococcus species DETECTED (A) NOT DETECTED Final    Comment: Not Enterococcus species, Streptococcus agalactiae, Streptococcus pyogenes, or Streptococcus pneumoniae. CRITICAL RESULT CALLED TO, READ BACK BY AND VERIFIED WITH: PHARMD T. RUDISILL [id number] FH    Streptococcus agalactiae NOT DETECTED NOT DETECTED Final   Streptococcus pneumoniae NOT DETECTED NOT DETECTED Final   Streptococcus pyogenes NOT DETECTED NOT DETECTED Final  A.calcoaceticus-baumannii NOT DETECTED NOT DETECTED Final   Bacteroides fragilis NOT DETECTED NOT DETECTED Final   Enterobacterales NOT DETECTED NOT DETECTED Final   Enterobacter cloacae complex NOT DETECTED NOT DETECTED Final   Escherichia coli NOT DETECTED NOT DETECTED Final   Klebsiella aerogenes NOT DETECTED NOT DETECTED Final   Klebsiella oxytoca NOT DETECTED NOT DETECTED Final   Klebsiella pneumoniae NOT DETECTED NOT DETECTED Final   Proteus species NOT DETECTED NOT DETECTED Final   Salmonella species NOT DETECTED NOT DETECTED Final   Serratia marcescens NOT DETECTED NOT DETECTED Final   Haemophilus influenzae NOT DETECTED NOT DETECTED Final   Neisseria meningitidis NOT DETECTED NOT DETECTED Final   Pseudomonas aeruginosa NOT DETECTED NOT DETECTED Final   Stenotrophomonas maltophilia NOT DETECTED NOT DETECTED Final   Candida albicans NOT DETECTED NOT DETECTED Final   Candida auris NOT DETECTED NOT DETECTED Final   Candida glabrata NOT DETECTED NOT DETECTED Final   Candida krusei NOT DETECTED NOT DETECTED Final   Candida parapsilosis NOT DETECTED NOT DETECTED Final   Candida tropicalis NOT DETECTED NOT DETECTED Final   Cryptococcus neoformans/gattii NOT DETECTED NOT DETECTED Final    Comment: Performed at [facility],  [place] [address] [place], [place] [address]  Resp Panel by RT-PCR (Flu A&B, Covid) Anterior Nasal Swab     Status: None   Collection Time: 07/03/22 11:36 PM   Specimen: Anterior Nasal Swab  Result Value Ref Range Status   SARS Coronavirus 2 by RT PCR NEGATIVE NEGATIVE Final    Comment: (NOTE) SARS-CoV-2 target nucleic acids are NOT DETECTED.  The SARS-CoV-2 RNA is generally detectable in upper respiratory specimens during the acute phase of infection. The lowest concentration of SARS-CoV-2 viral copies this assay can detect is 138 copies/mL. A negative result does not preclude SARS-Cov-2 infection and should not be used as the sole basis for treatment or other patient management decisions. A negative result may occur with  improper specimen collection/handling, submission of specimen other than nasopharyngeal swab, presence of viral mutation(s) within the areas targeted by this assay, and inadequate number of viral copies(<138 copies/mL). A negative result must be combined with clinical observations, patient history, and epidemiological information. The expected result is Negative.  Fact Sheet for Patients:  EntrepreneurPulse.com.au  Fact Sheet for Healthcare Providers:  IncredibleEmployment.be  This test is no t yet approved or cleared by the [place] FDA and  has been authorized for detection and/or diagnosis of SARS-CoV-2 by FDA under an Emergency Use Authorization (EUA). This EUA will remain  in effect (meaning this test can be used) for the duration of the COVID-19 declaration under Section 564(b)(1) of the Act, 21 U.S.C.section 360bbb-3(b)(1), unless the authorization is terminated  or revoked sooner.       Influenza A by PCR NEGATIVE NEGATIVE Final   Influenza B by PCR NEGATIVE NEGATIVE Final    Comment: (NOTE) The Xpert Xpress SARS-CoV-2/FLU/RSV plus assay is intended as an aid in the diagnosis of influenza from Nasopharyngeal swab  specimens and should not be used as a sole basis for treatment. Nasal washings and aspirates are unacceptable for Xpert Xpress SARS-CoV-2/FLU/RSV testing.  Fact Sheet for Patients: EntrepreneurPulse.com.au  Fact Sheet for Healthcare Providers: IncredibleEmployment.be  This test is not yet approved or cleared by the [place] FDA and has been authorized for detection and/or diagnosis of SARS-CoV-2 by FDA under an Emergency Use Authorization (EUA). This EUA will remain in effect (meaning this test can be used) for the duration of the  COVID-19 declaration under Section 564(b)(1) of the Act, 21 U.S.C. section 360bbb-3(b)(1), unless the authorization is terminated or revoked.  Performed at [facility], [place] [address] [place], [place] [address]   Urine Culture     Status: None   Collection Time: 07/03/22 11:50 PM   Specimen: Urine, Catheterized  Result Value Ref Range Status   Specimen Description URINE, CATHETERIZED  Final   Special Requests NONE  Final   Culture   Final    NO GROWTH Performed at [facility], [address], [place], [place] [address]    Report Status 07/05/2022 FINAL  Final  Culture, blood (Routine X 2) w Reflex to ID Panel     Status: None   Collection Time: 07/04/22  6:00 AM   Specimen: BLOOD  Result Value Ref Range Status   Specimen Description BLOOD SITE NOT SPECIFIED  Final   Special Requests   Final    BOTTLES DRAWN AEROBIC AND ANAEROBIC Blood Culture adequate volume   Culture   Final    NO GROWTH 5 DAYS Performed at [facility], [place] [address], [place], [place] [address]    Report Status 07/09/2022 FINAL  Final  Culture, Respiratory w Gram Stain     Status: None   Collection Time: 07/04/22 11:58 AM   Specimen: Bronchoalveolar Lavage; Respiratory  Result Value Ref Range Status   Specimen Description BRONCHIAL ALVEOLAR LAVAGE  Final   Special Requests NONE  Final   Gram Stain   Final     FEW WBC PRESENT,BOTH PMN AND MONONUCLEAR NO ORGANISMS SEEN Performed at [facility], [address], [place], [place] [address]    Culture RARE STAPHYLOCOCCUS AUREUS  Final   Report Status 07/08/2022 FINAL  Final   Organism ID, Bacteria STAPHYLOCOCCUS AUREUS  Final      Susceptibility   Staphylococcus aureus - MIC*    CIPROFLOXACIN <=0.5 SENSITIVE Sensitive     ERYTHROMYCIN <=0.25 SENSITIVE Sensitive     GENTAMICIN <=0.5 SENSITIVE Sensitive     OXACILLIN <=0.25 SENSITIVE Sensitive     TETRACYCLINE <=1 SENSITIVE Sensitive     VANCOMYCIN <=0.5 SENSITIVE Sensitive     TRIMETH/SULFA <=10 SENSITIVE Sensitive     CLINDAMYCIN <=0.25 SENSITIVE Sensitive     RIFAMPIN <=0.5 SENSITIVE Sensitive     Inducible Clindamycin NEGATIVE Sensitive     * RARE STAPHYLOCOCCUS AUREUS  Culture, blood (Routine X 2) w Reflex to ID Panel     Status: None (Preliminary result)   Collection Time: 07/06/22 11:47 AM   Specimen: BLOOD RIGHT HAND  Result Value Ref Range Status   Specimen Description BLOOD RIGHT HAND  Final   Special Requests   Final    BOTTLES DRAWN AEROBIC AND ANAEROBIC Blood Culture adequate volume   Culture   Final    NO GROWTH 3 DAYS Performed at [facility], [address], [place], [place] [address]    Report Status PENDING  Incomplete  Culture, blood (Routine X 2) w Reflex to ID Panel     Status: None (Preliminary result)   Collection Time: 07/06/22 11:57 AM   Specimen: BLOOD RIGHT HAND  Result Value Ref Range Status   Specimen Description BLOOD RIGHT HAND  Final   Special Requests   Final    BOTTLES DRAWN AEROBIC AND ANAEROBIC Blood Culture adequate volume   Culture   Final    NO GROWTH 3 DAYS Performed at [facility], [place]  [address] [place], [place] [address]    Report Status PENDING  Incomplete    Labs: CBC: Recent Labs  Lab 07/03/22 2242 07/03/22 2250 07/05/22 0436 07/06/22 0452 07/07/22 0429 07/08/22 0331 07/09/22 0413  WBC 16.5*   < > 12.2*  9.6 8.1 7.5 10.4  NEUTROABS 14.5*  --   --   --   --   --   --   HGB 14.4   < > 13.4 11.6* 12.7* 13.0 14.0  HCT 43.7   < > 36.9* 33.6* 36.6* 37.5* 40.1  MCV 79.5*   < > 73.4* 75.0* 74.7* 74.6* 74.3*  PLT 282   < > 259 234 265 283 328   < > = values in this interval not displayed.   Basic Metabolic Panel: Recent Labs  Lab 07/05/22 0436 07/05/22 1952 07/06/22 0452 07/07/22 0429 07/08/22 0331 07/09/22 0413  NA 137  --  137 136 138 139  K 3.2*  --  3.5 3.7 4.0 4.1  CL 105  --  104 105 108 105  CO2 23  --  _0 GLUCOSE 154*  --  112* 115* 124* 132*  BUN 9  --  _1 CREATININE 1.10  --  1.04 0.98 0.93 0.87  CALCIUM 8.9  --  8.4* 8.9 9.1 9.7  MG 1.5*  --  1.9 1.9 1.8 1.9  PHOS <1.0* 3.5 2.7 2.3* 3.3 3.5   Liver Function Tests: Recent Labs  Lab 07/05/22 0436 07/06/22 0452 07/07/22 0429 07/08/22 0331 07/09/22 0413  AST 58* 52* 48* 75* 122*  ALT 75* 52* 47* 63* 114*  ALKPHOS 58 61 67 87 91  BILITOT 1.1 0.6 0.5 0.5 0.6  PROT 6.2* 6.0* 6.5 6.8 7.1  ALBUMIN 2.9* 2.6* 2.9* 3.0* 3.4*   CBG: Recent Labs  Lab 07/04/22 0737 07/04/22 1151 07/05/22 0749 07/05/22 1125 07/05/22 1537  GLUCAP 121* 167* 130* 122* 144*    Discharge time spent: approximately 35 minutes spent on discharge counseling, evaluation of patient on day of discharge, and coordination of discharge planning with nursing, social work, pharmacy and case management  Signed: Edwin Dada, MD [facility] 07/09/2022

## 2022-07-09 NOTE — TOC Transition Note (Signed)
Transition of Care [facility]) - CM/SW Discharge Note   Patient Details  Name: Jeffery Blackwell MRN: [id number] Date of Birth: 01/31/1981  [facility] of [facility] CM/SW Contact:  Carles Collet, RN Phone Number: 07/09/2022, 12:10 PM   Clinical Narrative:     Jeffery Blackwell w pharmacist at [facility], provided with [facility] numbers, will cover Eliquis with 30 day card  Final next level of care: OP Rehab Barriers to Discharge: Continued Medical Work up   Patient Goals and CMS Choice Patient states their goals for this hospitalization and ongoing recovery are:: To return home CMS Medicare.gov Compare Post Acute Care list provided to:: Patient Choice offered to / list presented to : Patient  Discharge Placement                       Discharge Plan and Services In-house Referral: PCP / [facility] Discharge Planning Services: CM Consult Post Acute Care Choice: NA            DME Agency: NA       HH Arranged: NA          Social Determinants of Health (SDOH) Interventions     Readmission Risk Interventions    07/07/2022    3:40 PM  Readmission Risk Prevention Plan  Post Dischage Appt Complete  Medication Screening Complete  Transportation Screening Complete

## 2022-07-11 ENCOUNTER — Other Ambulatory Visit: Payer: Self-pay

## 2022-07-11 LAB — CULTURE, BLOOD (ROUTINE X 2)
Culture: NO GROWTH
Culture: NO GROWTH
Special Requests: ADEQUATE
Special Requests: ADEQUATE

## 2022-07-12 ENCOUNTER — Other Ambulatory Visit (HOSPITAL_COMMUNITY): Payer: Self-pay

## 2022-07-12 ENCOUNTER — Telehealth (HOSPITAL_COMMUNITY): Payer: Self-pay

## 2022-07-12 NOTE — Telephone Encounter (Signed)
Pharmacy Transitions of Care Follow-up Telephone Call  Date of discharge: 11/4  Discharge Diagnosis: PEA  How have you been since you were released from the hospital? He has been doing well since discharge. We discussed s/sx of bleeding along with OTC pain medications he can take if needed.  He asked about if he could eat mayonnaise.    Medication changes made at discharge: START taking these medications  START taking these medications atorvastatin 40 MG tablet Commonly known as: LIPITOR Place 1 tablet (40 mg total) into feeding tube daily.  Eliquis DVT/PE Starter Pack Generic drug: Apixaban Starter Pack (10mg  and 5mg ) Take as directed on package: start with two-5mg  tablets twice daily for 7 days. On day 8, switch to one-5mg  tablet twice daily.   CHANGE how you take these medications  CHANGE how you take these medications albuterol 108 (90 Base) MCG/ACT inhaler Commonly known as: VENTOLIN HFA Inhale 1-2 puffs into the lungs every 6 (six) hours as needed for wheezing or shortness of breath. What changed: when to take this   CONTINUE taking these medications  CONTINUE taking these medications multivitamin with minerals tablet    Medication changes verified by the patient? yes  Medication Accessibility:  Home Pharm[facility]    Was the patient provided with refills on discharged medications? No refills provided- educated to ask for refills at his follow up appointment at the end of the month  Is the patient interested in using a [place]? 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 Will switch to apixaban 5 mg BID after 7 days (DATE 11/11).  - Discussed importance of taking medication around the same time every day.  - Advised patient of medications to avoid (NSAIDs, ASA maintenance doses>100 mg daily)  - Educated that Tylenol (acetaminophen) will be the preferred analgesic to prevent risk of bleeding. -  Emphasized importance of monitoring for signs and symptoms of bleeding (abnormal bruising, prolonged bleeding, nose bleeds, bleeding from gums, discolored urine, black tarry stools)  - Advised patient to alert all providers of anticoagulation therapy prior to starting a new medication or having a procedure.  Follow-up Appointments:   08/03/2022  2:10 PM HOSPITAL FU 20 min [place] [[phone]]   Maryan Puls, PharmD PGY-1 [facility] Resident

## 2022-08-03 ENCOUNTER — Encounter (INDEPENDENT_AMBULATORY_CARE_PROVIDER_SITE_OTHER): Payer: Self-pay | Admitting: Primary Care

## 2022-08-03 ENCOUNTER — Other Ambulatory Visit: Payer: Self-pay

## 2022-08-03 ENCOUNTER — Ambulatory Visit (INDEPENDENT_AMBULATORY_CARE_PROVIDER_SITE_OTHER): Payer: Self-pay | Admitting: Primary Care

## 2022-08-03 VITALS — BP 140/86 | HR 65 | Resp 16 | Ht 72.0 in | Wt 296.0 lb

## 2022-08-03 DIAGNOSIS — F1910 Other psychoactive substance abuse, uncomplicated: Secondary | ICD-10-CM

## 2022-08-03 DIAGNOSIS — Z09 Encounter for follow-up examination after completed treatment for conditions other than malignant neoplasm: Secondary | ICD-10-CM

## 2022-08-03 DIAGNOSIS — Z131 Encounter for screening for diabetes mellitus: Secondary | ICD-10-CM

## 2022-08-03 DIAGNOSIS — I82622 Acute embolism and thrombosis of deep veins of left upper extremity: Secondary | ICD-10-CM

## 2022-08-03 DIAGNOSIS — M542 Cervicalgia: Secondary | ICD-10-CM

## 2022-08-03 DIAGNOSIS — Z1159 Encounter for screening for other viral diseases: Secondary | ICD-10-CM

## 2022-08-03 DIAGNOSIS — M25511 Pain in right shoulder: Secondary | ICD-10-CM

## 2022-08-03 DIAGNOSIS — R7303 Prediabetes: Secondary | ICD-10-CM

## 2022-08-03 DIAGNOSIS — Z7689 Persons encountering health services in other specified circumstances: Secondary | ICD-10-CM

## 2022-08-03 DIAGNOSIS — Z9189 Other specified personal risk factors, not elsewhere classified: Secondary | ICD-10-CM

## 2022-08-03 MED ORDER — APIXABAN 5 MG PO TABS
5.0000 mg | ORAL_TABLET | Freq: Two times a day (BID) | ORAL | 3 refills | Status: DC
  Filled 2022-08-03: qty 30, 15d supply, fill #0
  Filled 2022-12-15: qty 60, 30d supply, fill #1

## 2022-08-03 NOTE — Patient Instructions (Signed)
Calorie Counting for Weight Loss Calories are units of energy. Your body needs a certain number of calories from food to keep going throughout the day. When you eat or drink more calories than your body needs, your body stores the extra calories mostly as fat. When you eat or drink fewer calories than your body needs, your body burns fat to get the energy it needs. Calorie counting means keeping track of how many calories you eat and drink each day. Calorie counting can be helpful if you need to lose weight. If you eat fewer calories than your body needs, you should lose weight. Ask your health care provider what a healthy weight is for you. For calorie counting to work, you will need to eat the right number of calories each day to lose a healthy amount of weight per week. A dietitian can help you figure out how many calories you need in a day and will suggest ways to reach your calorie goal. A healthy amount of weight to lose each week is usually 1-2 lb (0.5-0.9 kg). This usually means that your daily calorie intake should be reduced by 500-750 calories. Eating 1,200-1,500 calories a day can help most women lose weight. Eating 1,500-1,800 calories a day can help most men lose weight. What do I need to know about calorie counting? Work with your health care provider or dietitian to determine how many calories you should get each day. To meet your daily calorie goal, you will need to: Find out how many calories are in each food that you would like to eat. Try to do this before you eat. Decide how much of the food you plan to eat. Keep a food log. Do this by writing down what you ate and how many calories it had. To successfully lose weight, it is important to balance calorie counting with a healthy lifestyle that includes regular activity. Where do I find calorie information?  The number of calories in a food can be found on a Nutrition Facts label. If a food does not have a Nutrition Facts label, try  to look up the calories online or ask your dietitian for help. Remember that calories are listed per serving. If you choose to have more than one serving of a food, you will have to multiply the calories per serving by the number of servings you plan to eat. For example, the label on a package of bread might say that a serving size is 1 slice and that there are 90 calories in a serving. If you eat 1 slice, you will have eaten 90 calories. If you eat 2 slices, you will have eaten 180 calories. How do I keep a food log? After each time that you eat, record the following in your food log as soon as possible: What you ate. Be sure to include toppings, sauces, and other extras on the food. How much you ate. This can be measured in cups, ounces, or number of items. How many calories were in each food and drink. The total number of calories in the food you ate. Keep your food log near you, such as in a pocket-sized notebook or on an app or website on your mobile phone. Some programs will calculate calories for you and show you how many calories you have left to meet your daily goal. What are some portion-control tips? Know how many calories are in a serving. This will help you know how many servings you can have of a certain   food. Use a measuring cup to measure serving sizes. You could also try weighing out portions on a kitchen scale. With time, you will be able to estimate serving sizes for some foods. Take time to put servings of different foods on your favorite plates or in your favorite bowls and cups so you know what a serving looks like. Try not to eat straight from a food's packaging, such as from a bag or box. Eating straight from the package makes it hard to see how much you are eating and can lead to overeating. Put the amount you would like to eat in a cup or on a plate to make sure you are eating the right portion. Use smaller plates, glasses, and bowls for smaller portions and to prevent  overeating. Try not to multitask. For example, avoid watching TV or using your computer while eating. If it is time to eat, sit down at a table and enjoy your food. This will help you recognize when you are full. It will also help you be more mindful of what and how much you are eating. What are tips for following this plan? Reading food labels Check the calorie count compared with the serving size. The serving size may be smaller than what you are used to eating. Check the source of the calories. Try to choose foods that are high in protein, fiber, and vitamins, and low in saturated fat, trans fat, and sodium. Shopping Read nutrition labels while you shop. This will help you make healthy decisions about which foods to buy. Pay attention to nutrition labels for low-fat or fat-free foods. These foods sometimes have the same number of calories or more calories than the full-fat versions. They also often have added sugar, starch, or salt to make up for flavor that was removed with the fat. Make a grocery list of lower-calorie foods and stick to it. Cooking Try to cook your favorite foods in a healthier way. For example, try baking instead of frying. Use low-fat dairy products. Meal planning Use more fruits and vegetables. One-half of your plate should be fruits and vegetables. Include lean proteins, such as chicken, turkey, and fish. Lifestyle Each week, aim to do one of the following: 150 minutes of moderate exercise, such as walking. 75 minutes of vigorous exercise, such as running. General information Know how many calories are in the foods you eat most often. This will help you calculate calorie counts faster. Find a way of tracking calories that works for you. Get creative. Try different apps or programs if writing down calories does not work for you. What foods should I eat?  Eat nutritious foods. It is better to have a nutritious, high-calorie food, such as an avocado, than a food with  few nutrients, such as a bag of potato chips. Use your calories on foods and drinks that will fill you up and will not leave you hungry soon after eating. Examples of foods that fill you up are nuts and nut butters, vegetables, lean proteins, and high-fiber foods such as whole grains. High-fiber foods are foods with more than 5 g of fiber per serving. Pay attention to calories in drinks. Low-calorie drinks include water and unsweetened drinks. The items listed above may not be a complete list of foods and beverages you can eat. Contact a dietitian for more information. What foods should I limit? Limit foods or drinks that are not good sources of vitamins, minerals, or protein or that are high in unhealthy fats. These   include: Candy. Other sweets. Sodas, specialty coffee drinks, alcohol, and juice. The items listed above may not be a complete list of foods and beverages you should avoid. Contact a dietitian for more information. How do I count calories when eating out? Pay attention to portions. Often, portions are much larger when eating out. Try these tips to keep portions smaller: Consider sharing a meal instead of getting your own. If you get your own meal, eat only half of it. Before you start eating, ask for a container and put half of your meal into it. When available, consider ordering smaller portions from the menu instead of full portions. Pay attention to your food and drink choices. Knowing the way food is cooked and what is included with the meal can help you eat fewer calories. If calories are listed on the menu, choose the lower-calorie options. Choose dishes that include vegetables, fruits, whole grains, low-fat dairy products, and lean proteins. Choose items that are boiled, broiled, grilled, or steamed. Avoid items that are buttered, battered, fried, or served with cream sauce. Items labeled as crispy are usually fried, unless stated otherwise. Choose water, low-fat milk,  unsweetened iced tea, or other drinks without added sugar. If you want an alcoholic beverage, choose a lower-calorie option, such as a glass of wine or light beer. Ask for dressings, sauces, and syrups on the side. These are usually high in calories, so you should limit the amount you eat. If you want a salad, choose a garden salad and ask for grilled meats. Avoid extra toppings such as bacon, cheese, or fried items. Ask for the dressing on the side, or ask for olive oil and vinegar or lemon to use as dressing. Estimate how many servings of a food you are given. Knowing serving sizes will help you be aware of how much food you are eating at restaurants. Where to find more information Centers for Disease Control and Prevention: http://www.wolf.info/ U.[place]. Department of Agriculture: http://www.wilson-mendoza.org/ Summary Calorie counting means keeping track of how many calories you eat and drink each day. If you eat fewer calories than your body needs, you should lose weight. A healthy amount of weight to lose per week is usually 1-2 lb (0.5-0.9 kg). This usually means reducing your daily calorie intake by 500-750 calories. The number of calories in a food can be found on a Nutrition Facts label. If a food does not have a Nutrition Facts label, try to look up the calories online or ask your dietitian for help. Use smaller plates, glasses, and bowls for smaller portions and to prevent overeating. Use your calories on foods and drinks that will fill you up and not leave you hungry shortly after a meal. This information is not intended to replace advice given to you by your health care provider. Make sure you discuss any questions you have with your health care provider. Document Revised: 10/03/2019 Document Reviewed: 10/03/2019 Elsevier Patient Education  [employer]. Preventing Hypertension Hypertension, also called high blood pressure, is when the force of blood pumping through the arteries is too strong. Arteries are blood  vessels that carry blood from the heart throughout the body. Often, hypertension does not cause symptoms until blood pressure is very high. It is important to have your blood pressure checked regularly. Diet and lifestyle changes can help you prevent hypertension, and they may make you feel better overall and improve your quality of life. If you already have hypertension, you may control it with diet and lifestyle changes,  as well as with medicine. How can this condition affect me? Over time, hypertension can damage the arteries and decrease blood flow to important parts of the body, including the brain, heart, and kidneys. By keeping your blood pressure in a healthy range, you can help prevent complications like heart attack, heart failure, stroke, kidney failure, and vascular dementia. What can increase my risk? An unhealthy diet and a lack of physical activity can make you more likely to develop high blood pressure. Some other risk factors include: Age. The risk increases with age. Having family members who have had high blood pressure. Having certain health conditions, such as thyroid problems. Being overweight or obese. Drinking too much alcohol or caffeine. Having too much fat, sugar, calories, or salt (sodium) in your diet. Smoking or using illegal drugs. Taking certain medicines, such as antidepressants, decongestants, birth control pills, and NSAIDs, such as ibuprofen. What actions can I take to prevent or manage this condition? Work with your health care provider to make a hypertension prevention plan that works for you. You may be referred for counseling on a healthy diet and physical activity. Follow your plan and keep all follow-up visits. Diet changes Maintain a healthy diet. This includes: Eating less salt (sodium). Ask your health care provider how much sodium is safe for you to have. The general recommendation is to have less than 1 tsp (2,300 mg) of sodium a day. Do not add salt  to your food. Choose low-sodium options when grocery shopping and eating out. Limiting fats in your diet. You can do this by eating low-fat or fat-free dairy products and by eating less red meat. Eating more fruits, vegetables, and whole grains. Make a goal to eat: 1-2 cups of fresh fruits and vegetables each day. 3-4 servings of whole grains each day. Avoiding foods and beverages that have added sugars. Eating fish that contain healthy fats (omega-3 fatty acids), such as mackerel or salmon. If you need help putting together a healthy eating plan, try the DASH diet. This diet is high in fruits, vegetables, and whole grains. It is low in sodium, red meat, and added sugars. DASH stands for Dietary Approaches to Stop Hypertension. Lifestyle changes  Lose weight if you are overweight. Losing just 3-5% of your body weight can help prevent or control hypertension. For example, if your present weight is 200 lb (91 kg), a loss of 3-5% of your weight means losing 6-10 lb (2.7-4.5 kg). Ask your health care provider to help you with a diet and exercise plan to safely lose weight. Get enough exercise. Do at least 150 minutes of moderate-intensity exercise each week. You could do this in short exercise sessions several times a day, or you could do longer exercise sessions a few times a week. For example, you could take a brisk 10-minute walk or bike ride, 3 times a day, for 5 days a week. Find ways to reduce stress, such as exercising, meditating, listening to music, or taking a yoga class. If you need help reducing stress, ask your health care provider. Do not use any products that contain nicotine or tobacco. These products include cigarettes, chewing tobacco, and vaping devices, such as e-cigarettes. Chemicals in tobacco and nicotine products raise your blood pressure each time you use them. If you need help quitting, ask your health care provider. Learn how to check your blood pressure at home. Make sure that  you know your personal target blood pressure, as told by your health care provider. Try to  sleep 7-9 hours per night. Alcohol use Do not drink alcohol if: Your health care provider tells you not to drink. You are pregnant, may be pregnant, or are planning to become pregnant. If you drink alcohol: Limit how much you have to: 0-1 drink a day for women. 0-2 drinks a day for men. Know how much alcohol is in your drink. In the [place] one drink equals one 12 oz bottle of beer (355 mL), one 5 oz glass of wine (148 mL), or one 1 oz glass of hard liquor (44 mL). Medicines In addition to diet and lifestyle changes, your health care provider may recommend medicines to help lower your blood pressure. In general: You may need to try a few different medicines to find what works best for you. You may need to take more than one medicine. Take over-the-counter and prescription medicines only as told by your health care provider. Questions to ask your health care provider What is my blood pressure goal? How can I lower my risk for high blood pressure? How should I monitor my blood pressure at home? Where to find support Your health care provider can help you prevent hypertension and help you keep your blood pressure at a healthy level. Your local hospital or your community may also provide support services and prevention programs. The American Heart Association offers an online support network at supportnetwork.heart.org Where to find more information Learn more about hypertension from: National Heart, Lung, and Blood Institute: www.nhlbi.nih.gov Centers for Disease Control and Prevention: www.cdc.gov American Academy of Family Physicians: familydoctor.org Learn more about the DASH diet from: National Heart, Lung, and Blood Institute: www.nhlbi.nih.gov Contact a health care provider if: You think you are having a reaction to medicines you have taken. You have recurrent headaches or feel dizzy. You  have swelling in your ankles. You have trouble with your vision. Get help right away if: You have sudden, severe chest, back, or abdominal pain or discomfort. You have shortness of breath. You have a sudden, severe headache. These symptoms may be an emergency. Get help right away. Call 911. Do not wait to see if the symptoms will go away. Do not drive yourself to the hospital. Summary Hypertension often does not cause any symptoms until blood pressure is very high. It is important to get your blood pressure checked regularly. Diet and lifestyle changes are important steps in preventing hypertension. By keeping your blood pressure in a healthy range, you may prevent complications like heart attack, heart failure, stroke, and kidney failure. Work with your health care provider to make a hypertension prevention plan that works for you. This information is not intended to replace advice given to you by your health care provider. Make sure you discuss any questions you have with your health care provider. Document Revised: 06/10/2021 Document Reviewed: 06/10/2021 Elsevier Patient Education  2023 Elsevier Inc.  
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
## 2022-08-04 ENCOUNTER — Other Ambulatory Visit: Payer: Self-pay

## 2022-08-04 LAB — HEPATIC FUNCTION PANEL
ALT: 28 IU/L (ref 0–44)
AST: 25 IU/L (ref 0–40)
Albumin: 4.6 g/dL (ref 4.1–5.1)
Alkaline Phosphatase: 112 IU/L (ref 44–121)
Bilirubin Total: 0.4 mg/dL (ref 0.0–1.2)
Bilirubin, Direct: 0.13 mg/dL (ref 0.00–0.40)
Total Protein: 7.6 g/dL (ref 6.0–8.5)

## 2022-08-04 LAB — HEMOGLOBIN A1C
Est. average glucose Bld gHb Est-mCnc: 128 mg/dL
Hgb A1c MFr Bld: 6.1 % — ABNORMAL HIGH (ref 4.8–5.6)

## 2022-08-04 LAB — HCV INTERPRETATION

## 2022-08-04 LAB — HCV AB W REFLEX TO QUANT PCR: HCV Ab: NONREACTIVE

## 2022-08-05 ENCOUNTER — Other Ambulatory Visit: Payer: Self-pay

## 2022-08-09 ENCOUNTER — Other Ambulatory Visit: Payer: Self-pay

## 2022-08-11 ENCOUNTER — Other Ambulatory Visit: Payer: Self-pay

## 2022-08-12 ENCOUNTER — Other Ambulatory Visit: Payer: Self-pay

## 2022-08-30 ENCOUNTER — Other Ambulatory Visit: Payer: Self-pay

## 2022-08-31 ENCOUNTER — Ambulatory Visit (INDEPENDENT_AMBULATORY_CARE_PROVIDER_SITE_OTHER): Payer: Self-pay

## 2022-09-08 ENCOUNTER — Ambulatory Visit (INDEPENDENT_AMBULATORY_CARE_PROVIDER_SITE_OTHER): Payer: Self-pay

## 2022-09-08 ENCOUNTER — Other Ambulatory Visit: Payer: Self-pay

## 2022-09-12 ENCOUNTER — Other Ambulatory Visit: Payer: Self-pay

## 2022-09-15 ENCOUNTER — Ambulatory Visit (INDEPENDENT_AMBULATORY_CARE_PROVIDER_SITE_OTHER): Payer: Commercial Managed Care - HMO

## 2022-09-15 ENCOUNTER — Telehealth: Payer: Self-pay

## 2022-09-15 NOTE — Telephone Encounter (Signed)
Jeffery Blackwell was seen today for a blood pressure check. BP 136/82 Pulse 83 O2 96% .

## 2022-09-15 NOTE — Progress Notes (Signed)
Boomer was seen today for a blood pressure check. BP 136/82 Pulse 83 O2 96% .

## 2022-10-12 ENCOUNTER — Other Ambulatory Visit: Payer: Self-pay

## 2022-11-04 ENCOUNTER — Other Ambulatory Visit: Payer: Self-pay

## 2022-12-15 ENCOUNTER — Other Ambulatory Visit (HOSPITAL_COMMUNITY): Payer: Self-pay

## 2022-12-15 ENCOUNTER — Other Ambulatory Visit (INDEPENDENT_AMBULATORY_CARE_PROVIDER_SITE_OTHER): Payer: Self-pay | Admitting: Primary Care

## 2022-12-15 ENCOUNTER — Encounter (INDEPENDENT_AMBULATORY_CARE_PROVIDER_SITE_OTHER): Payer: Self-pay | Admitting: Primary Care

## 2022-12-15 ENCOUNTER — Other Ambulatory Visit: Payer: Self-pay

## 2022-12-15 ENCOUNTER — Ambulatory Visit (INDEPENDENT_AMBULATORY_CARE_PROVIDER_SITE_OTHER): Payer: Self-pay | Admitting: Primary Care

## 2022-12-15 VITALS — BP 115/78 | HR 90 | Temp 98.2°F | Resp 20 | Ht 72.0 in | Wt 307.0 lb

## 2022-12-15 DIAGNOSIS — E669 Obesity, unspecified: Secondary | ICD-10-CM

## 2022-12-15 DIAGNOSIS — I82622 Acute embolism and thrombosis of deep veins of left upper extremity: Secondary | ICD-10-CM

## 2022-12-15 DIAGNOSIS — J4520 Mild intermittent asthma, uncomplicated: Secondary | ICD-10-CM

## 2022-12-15 DIAGNOSIS — Z6841 Body Mass Index (BMI) 40.0 and over, adult: Secondary | ICD-10-CM

## 2022-12-15 DIAGNOSIS — T7840XA Allergy, unspecified, initial encounter: Secondary | ICD-10-CM

## 2022-12-15 DIAGNOSIS — E782 Mixed hyperlipidemia: Secondary | ICD-10-CM

## 2022-12-15 MED ORDER — ALBUTEROL SULFATE HFA 108 (90 BASE) MCG/ACT IN AERS
1.0000 | INHALATION_SPRAY | Freq: Four times a day (QID) | RESPIRATORY_TRACT | 1 refills | Status: DC | PRN
  Filled 2022-12-15: qty 6.7, 25d supply, fill #0
  Filled 2022-12-15: qty 6.7, 20d supply, fill #0

## 2022-12-15 MED ORDER — FLUTICASONE PROPIONATE 50 MCG/ACT NA SUSP
2.0000 | Freq: Every day | NASAL | 6 refills | Status: DC
  Filled 2022-12-15: qty 16, 30d supply, fill #0

## 2022-12-15 MED ORDER — FLUTICASONE PROPIONATE 50 MCG/ACT NA SUSP
2.0000 | Freq: Every day | NASAL | 6 refills | Status: DC
  Filled 2022-12-16: qty 16, 30d supply, fill #0

## 2022-12-15 MED ORDER — APIXABAN 5 MG PO TABS
5.0000 mg | ORAL_TABLET | Freq: Two times a day (BID) | ORAL | 3 refills | Status: DC
  Filled 2022-12-15 (×2): qty 60, 30d supply, fill #0

## 2022-12-15 MED ORDER — LEVOCETIRIZINE DIHYDROCHLORIDE 5 MG PO TABS
5.0000 mg | ORAL_TABLET | Freq: Every evening | ORAL | 2 refills | Status: DC
Start: 2022-12-15 — End: 2023-08-15
  Filled 2022-12-16: qty 30, 30d supply, fill #0

## 2022-12-15 MED ORDER — APIXABAN 5 MG PO TABS
5.0000 mg | ORAL_TABLET | Freq: Two times a day (BID) | ORAL | 3 refills | Status: AC
  Filled 2022-12-15 – 2022-12-16 (×2): qty 60, 30d supply, fill #0

## 2022-12-15 MED ORDER — ALBUTEROL SULFATE HFA 108 (90 BASE) MCG/ACT IN AERS
1.0000 | INHALATION_SPRAY | Freq: Four times a day (QID) | RESPIRATORY_TRACT | 1 refills | Status: DC | PRN
  Filled 2022-12-15: qty 6.7, fill #0
  Filled 2022-12-16: qty 6.7, 25d supply, fill #0

## 2022-12-15 MED ORDER — LEVOCETIRIZINE DIHYDROCHLORIDE 5 MG PO TABS
5.0000 mg | ORAL_TABLET | Freq: Every evening | ORAL | 2 refills | Status: DC
  Filled 2022-12-15: qty 30, 30d supply, fill #0

## 2022-12-15 MED ORDER — GUAIFENESIN 200 MG PO TABS
200.0000 mg | ORAL_TABLET | ORAL | 0 refills | Status: DC | PRN
  Filled 2022-12-15: qty 30, 5d supply, fill #0

## 2022-12-15 MED ORDER — GUAIFENESIN 200 MG PO TABS
200.0000 mg | ORAL_TABLET | ORAL | 0 refills | Status: DC | PRN
Start: 2022-12-15 — End: 2023-08-15
  Filled 2022-12-15: qty 30, 5d supply, fill #0

## 2022-12-15 NOTE — Progress Notes (Signed)
Needs refill on blood thinner  Wheezing and coughing x 3 days.

## 2022-12-15 NOTE — Progress Notes (Signed)
[facility]  Jeffery Blackwell, is a 42 y.o. male  [id number]  [id number]  DOB - 05-Mar-1981  Chief Complaint  Patient presents with   DVT       Subjective:   Jeffery Blackwell is a 42 y.o. morbid male here today for a acute visit cough , rhinitis , weary eyes, and requesting medication refills.  Patient has No headache, No chest pain, No abdominal pain - No Nausea, No new weakness tingling or numbness, No Cough - shortness of breath  No problems updated.  Allergies  Allergen Reactions   Ibuprofen Swelling    Past Medical History:  Diagnosis Date   Asthma     Current Outpatient Medications on File Prior to Visit  Medication Sig Dispense Refill   Multiple Vitamins-Minerals (MULTIVITAMIN WITH MINERALS) tablet Take 1 tablet by mouth daily. (Patient not taking: Reported on 12/15/2022)     No current facility-administered medications on file prior to visit.    Objective:   Vitals:   12/15/22 1333  BP: 115/78  Pulse: 90  Resp: 20  Temp: 98.2 F (36.8 C)  TempSrc: Oral  SpO2: 97%  Weight: (Abnormal) 307 lb (139.3 kg)  Height: 6' (1.829 m)    Comprehensive ROS Pertinent positive and negative noted in HPI   Exam General appearance : Awake, alert, not in any distress. Speech Clear. Not toxic looking HEENT: Atraumatic and Normocephalic, pupils equally reactive to light and accomodation Neck: Supple, no JVD. No cervical lymphadenopathy.  Chest: Good air entry bilaterally, no added sounds  CVS: S1 S2 regular, no murmurs.  Abdomen: Bowel sounds present, Non tender and not distended with no gaurding, rigidity or rebound. Extremities: B/L Lower Ext shows no edema, both legs are warm to touch Neurology: Awake alert, and oriented X 3,  Non focal Skin: No Rash  Data Review Lab Results  Component Value Date   HGBA1C 6.1 (H) 08/03/2022    Assessment & Plan  Jeffery Blackwell was seen today for dvt.  Diagnoses and all orders for this visit:  Mixed  hyperlipidemia Lipid panel - not taking statin   Acute deep vein thrombosis (DVT) of brachial vein of left upper extremity -     apixaban (ELIQUIS) 5 MG TABS tablet; Take 1 tablet (5 mg total) by mouth 2 (two) times daily.  Allergy, initial encounter 2/2 Mild intermittent asthma without complication -     levocetirizine (XYZAL) 5 MG tablet; Take 1 tablet (5 mg total) by mouth every evening. -     fluticasone (FLONASE) 50 MCG/ACT nasal spray; Place 2 sprays into both nostrils daily. -     guaiFENesin 200 MG tablet; Take 1 tablet (200 mg total) by mouth every 4 (four) hours as needed for cough or to loosen phlegm.    Other orders -     albuterol (VENTOLIN HFA) 108 (90 Base) MCG/ACT inhaler; Inhale 1-2 puffs into the lungs every 6 (six) hours as needed for wheezing or shortness of breath.     Patient have been counseled extensively about nutrition and exercise. Other issues discussed during this visit include: low cholesterol diet, weight control and daily exercise, foot care, annual eye examinations at Ophthalmology, importance of adherence with medications and regular follow-up. We also discussed long term complications of uncontrolled diabetes and hypertension.   Return in about 6 months (around 06/16/2023) for medical conditions.  The patient was given clear instructions to go to ER or return to medical center if symptoms don't improve, worsen or new problems develop. The  patient verbalized understanding. The patient was told to call to get lab results if they haven't heard anything in the next week.   This note has been created with Education officer, environmental. Any transcriptional errors are unintentional.   Grayce Sessions, NP 12/15/2022, 2:09 PM

## 2022-12-15 NOTE — Patient Instructions (Signed)
[phone]  

## 2022-12-16 ENCOUNTER — Other Ambulatory Visit: Payer: Self-pay

## 2022-12-16 ENCOUNTER — Other Ambulatory Visit (HOSPITAL_COMMUNITY): Payer: Self-pay

## 2022-12-16 LAB — LIPID PANEL
Chol/HDL Ratio: 6.1 ratio — ABNORMAL HIGH (ref 0.0–5.0)
Cholesterol, Total: 208 mg/dL — ABNORMAL HIGH (ref 100–199)
HDL: 34 mg/dL — ABNORMAL LOW (ref >39)
LDL Chol Calc (NIH): 135 mg/dL — ABNORMAL HIGH (ref 0–99)
Triglycerides: 217 mg/dL — ABNORMAL HIGH (ref 0–149)
VLDL Cholesterol Cal: 39 mg/dL (ref 5–40)

## 2023-01-01 IMAGING — DX DG WRIST COMPLETE 3+V*L*
4 series · 4 of 4 positions shown · non-contrast
Comparison: None.

CLINICAL DATA: Bilateral wrist pain

EXAM:
LEFT WRIST - COMPLETE 3+ VIEW

[wrist pa navicular]
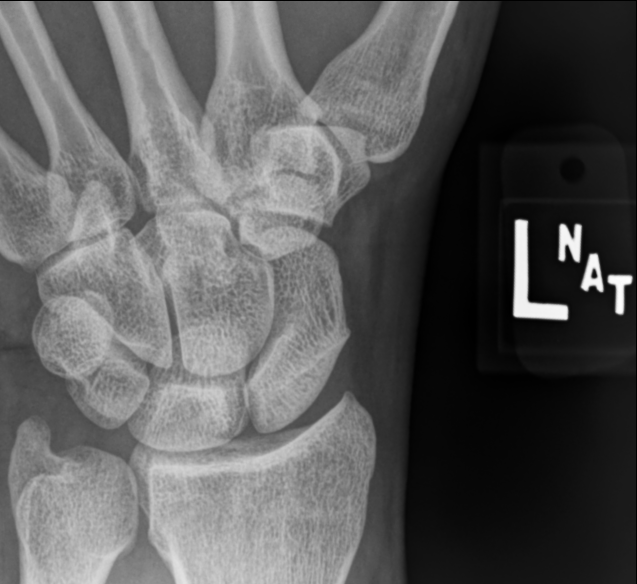

[wrist pa oblique]
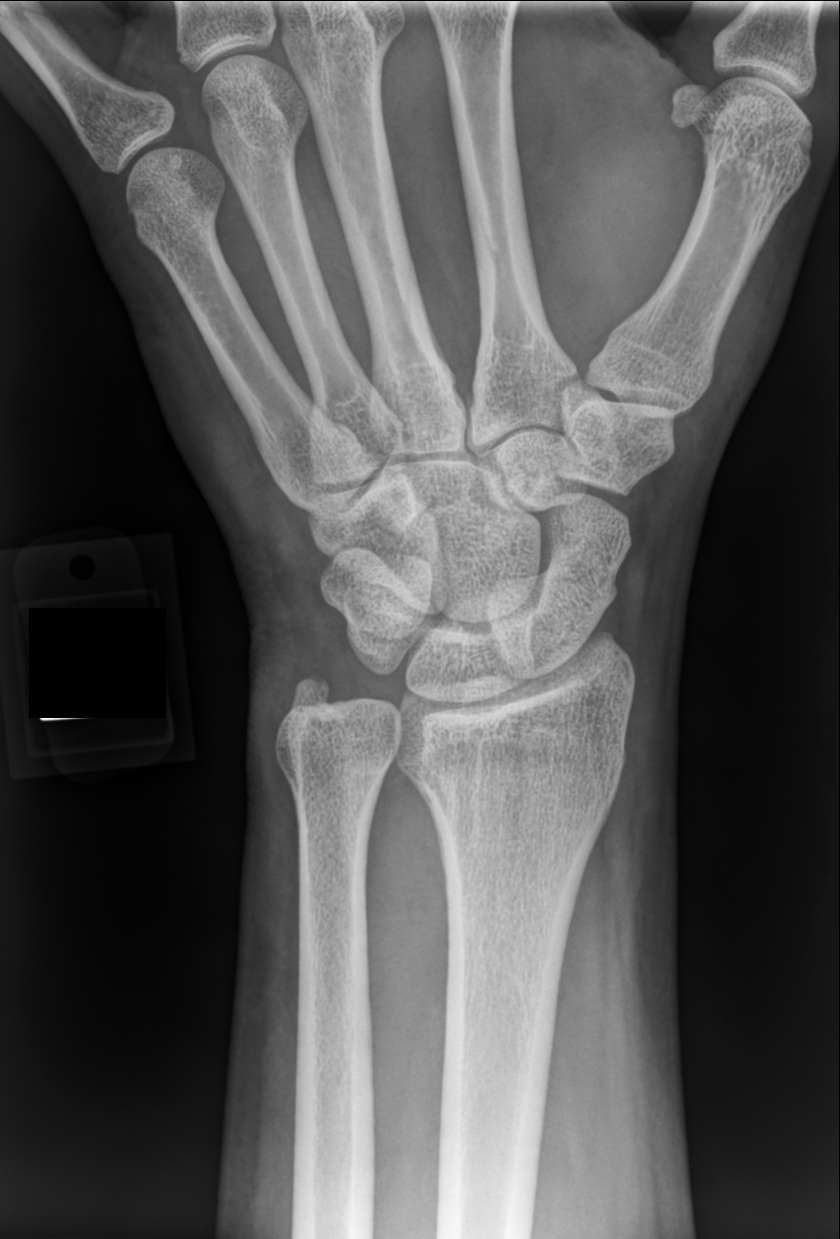

[wrist lat]
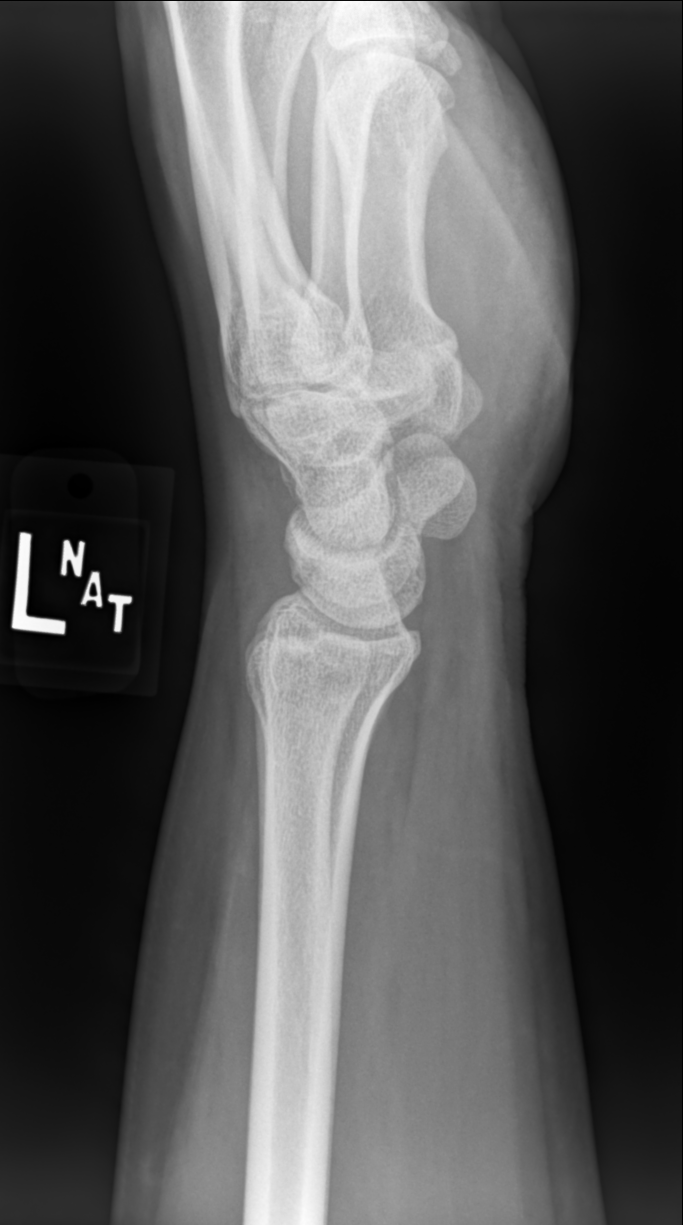

[wrist pa]
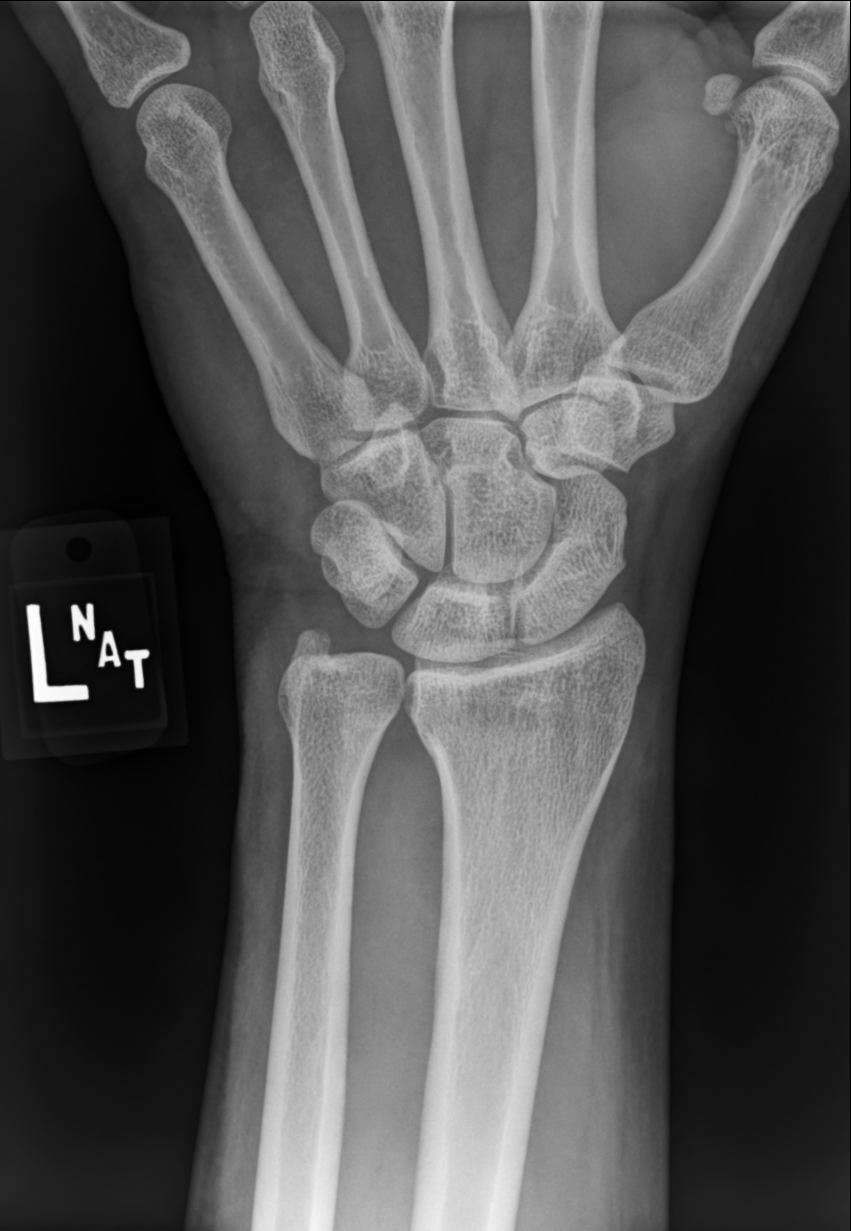

[4 of 4 positions shown; findings below may reference images not displayed]

FINDINGS: There is no evidence of fracture or dislocation. There is no
evidence of arthropathy or other focal bone abnormality. Soft
tissues are unremarkable.
IMPRESSION: Negative left wrist radiographs.

## 2023-06-16 ENCOUNTER — Ambulatory Visit (INDEPENDENT_AMBULATORY_CARE_PROVIDER_SITE_OTHER): Payer: Self-pay | Admitting: Primary Care

## 2023-06-20 ENCOUNTER — Ambulatory Visit (INDEPENDENT_AMBULATORY_CARE_PROVIDER_SITE_OTHER): Payer: BC Managed Care – PPO | Admitting: Primary Care

## 2023-06-20 ENCOUNTER — Encounter (INDEPENDENT_AMBULATORY_CARE_PROVIDER_SITE_OTHER): Payer: Self-pay | Admitting: Primary Care

## 2023-07-10 ENCOUNTER — Telehealth (INDEPENDENT_AMBULATORY_CARE_PROVIDER_SITE_OTHER): Payer: Self-pay | Admitting: Primary Care

## 2023-07-10 NOTE — Telephone Encounter (Signed)
Called to confirm apt. Pt will be present.

## 2023-07-11 ENCOUNTER — Ambulatory Visit (INDEPENDENT_AMBULATORY_CARE_PROVIDER_SITE_OTHER): Payer: BC Managed Care – PPO | Admitting: Primary Care

## 2023-08-10 ENCOUNTER — Ambulatory Visit (INDEPENDENT_AMBULATORY_CARE_PROVIDER_SITE_OTHER): Payer: BC Managed Care – PPO | Admitting: Primary Care

## 2023-08-15 ENCOUNTER — Encounter (INDEPENDENT_AMBULATORY_CARE_PROVIDER_SITE_OTHER): Payer: Self-pay | Admitting: Primary Care

## 2023-08-15 ENCOUNTER — Other Ambulatory Visit: Payer: Self-pay

## 2023-08-15 ENCOUNTER — Ambulatory Visit (INDEPENDENT_AMBULATORY_CARE_PROVIDER_SITE_OTHER): Payer: 59 | Admitting: Primary Care

## 2023-08-15 VITALS — BP 123/81 | HR 84 | Resp 16 | Wt 316.8 lb

## 2023-08-15 DIAGNOSIS — Z6841 Body Mass Index (BMI) 40.0 and over, adult: Secondary | ICD-10-CM

## 2023-08-15 DIAGNOSIS — E782 Mixed hyperlipidemia: Secondary | ICD-10-CM | POA: Diagnosis not present

## 2023-08-15 DIAGNOSIS — R7303 Prediabetes: Secondary | ICD-10-CM

## 2023-08-15 DIAGNOSIS — E6609 Other obesity due to excess calories: Secondary | ICD-10-CM | POA: Diagnosis not present

## 2023-08-15 DIAGNOSIS — I82622 Acute embolism and thrombosis of deep veins of left upper extremity: Secondary | ICD-10-CM | POA: Diagnosis not present

## 2023-08-15 DIAGNOSIS — E66813 Obesity, class 3: Secondary | ICD-10-CM

## 2023-08-15 DIAGNOSIS — T7840XA Allergy, unspecified, initial encounter: Secondary | ICD-10-CM | POA: Diagnosis not present

## 2023-08-15 MED ORDER — LEVOCETIRIZINE DIHYDROCHLORIDE 5 MG PO TABS
5.0000 mg | ORAL_TABLET | Freq: Every evening | ORAL | 2 refills | Status: AC
Start: 2023-08-15 — End: ?

## 2023-08-15 MED ORDER — ALBUTEROL SULFATE HFA 108 (90 BASE) MCG/ACT IN AERS: 1.0000 | INHALATION_SPRAY | Freq: Four times a day (QID) | RESPIRATORY_TRACT | 1 refills | Status: AC | PRN

## 2023-08-15 MED ORDER — FLUTICASONE PROPIONATE 50 MCG/ACT NA SUSP
2.0000 | Freq: Every day | NASAL | 6 refills | Status: AC
Start: 2023-08-15 — End: ?

## 2023-08-15 NOTE — Patient Instructions (Signed)
Calorie Counting for Weight Loss Calories are units of energy. Your body needs a certain number of calories from food to keep going throughout the day. When you eat or drink more calories than your body needs, your body stores the extra calories mostly as fat. When you eat or drink fewer calories than your body needs, your body burns fat to get the energy it needs. Calorie counting means keeping track of how many calories you eat and drink each day. Calorie counting can be helpful if you need to lose weight. If you eat fewer calories than your body needs, you should lose weight. Ask your health care provider what a healthy weight is for you. For calorie counting to work, you will need to eat the right number of calories each day to lose a healthy amount of weight per week. A dietitian can help you figure out how many calories you need in a day and will suggest ways to reach your calorie goal. A healthy amount of weight to lose each week is usually 1-2 lb (0.5-0.9 kg). This usually means that your daily calorie intake should be reduced by 500-750 calories. Eating 1,200-1,500 calories a day can help most women lose weight. Eating 1,500-1,800 calories a day can help most men lose weight. What do I need to know about calorie counting? Work with your health care provider or dietitian to determine how many calories you should get each day. To meet your daily calorie goal, you will need to: Find out how many calories are in each food that you would like to eat. Try to do this before you eat. Decide how much of the food you plan to eat. Keep a food log. Do this by writing down what you ate and how many calories it had. To successfully lose weight, it is important to balance calorie counting with a healthy lifestyle that includes regular activity. Where do I find calorie information?  The number of calories in a food can be found on a Nutrition Facts label. If a food does not have a Nutrition Facts label, try  to look up the calories online or ask your dietitian for help. Remember that calories are listed per serving. If you choose to have more than one serving of a food, you will have to multiply the calories per serving by the number of servings you plan to eat. For example, the label on a package of bread might say that a serving size is 1 slice and that there are 90 calories in a serving. If you eat 1 slice, you will have eaten 90 calories. If you eat 2 slices, you will have eaten 180 calories. How do I keep a food log? After each time that you eat, record the following in your food log as soon as possible: What you ate. Be sure to include toppings, sauces, and other extras on the food. How much you ate. This can be measured in cups, ounces, or number of items. How many calories were in each food and drink. The total number of calories in the food you ate. Keep your food log near you, such as in a pocket-sized notebook or on an app or website on your mobile phone. Some programs will calculate calories for you and show you how many calories you have left to meet your daily goal. What are some portion-control tips? Know how many calories are in a serving. This will help you know how many servings you can have of a certain   food. Use a measuring cup to measure serving sizes. You could also try weighing out portions on a kitchen scale. With time, you will be able to estimate serving sizes for some foods. Take time to put servings of different foods on your favorite plates or in your favorite bowls and cups so you know what a serving looks like. Try not to eat straight from a food's packaging, such as from a bag or box. Eating straight from the package makes it hard to see how much you are eating and can lead to overeating. Put the amount you would like to eat in a cup or on a plate to make sure you are eating the right portion. Use smaller plates, glasses, and bowls for smaller portions and to prevent  overeating. Try not to multitask. For example, avoid watching TV or using your computer while eating. If it is time to eat, sit down at a table and enjoy your food. This will help you recognize when you are full. It will also help you be more mindful of what and how much you are eating. What are tips for following this plan? Reading food labels Check the calorie count compared with the serving size. The serving size may be smaller than what you are used to eating. Check the source of the calories. Try to choose foods that are high in protein, fiber, and vitamins, and low in saturated fat, trans fat, and sodium. Shopping Read nutrition labels while you shop. This will help you make healthy decisions about which foods to buy. Pay attention to nutrition labels for low-fat or fat-free foods. These foods sometimes have the same number of calories or more calories than the full-fat versions. They also often have added sugar, starch, or salt to make up for flavor that was removed with the fat. Make a grocery list of lower-calorie foods and stick to it. Cooking Try to cook your favorite foods in a healthier way. For example, try baking instead of frying. Use low-fat dairy products. Meal planning Use more fruits and vegetables. One-half of your plate should be fruits and vegetables. Include lean proteins, such as chicken, turkey, and fish. Lifestyle Each week, aim to do one of the following: 150 minutes of moderate exercise, such as walking. 75 minutes of vigorous exercise, such as running. General information Know how many calories are in the foods you eat most often. This will help you calculate calorie counts faster. Find a way of tracking calories that works for you. Get creative. Try different apps or programs if writing down calories does not work for you. What foods should I eat?  Eat nutritious foods. It is better to have a nutritious, high-calorie food, such as an avocado, than a food with  few nutrients, such as a bag of potato chips. Use your calories on foods and drinks that will fill you up and will not leave you hungry soon after eating. Examples of foods that fill you up are nuts and nut butters, vegetables, lean proteins, and high-fiber foods such as whole grains. High-fiber foods are foods with more than 5 g of fiber per serving. Pay attention to calories in drinks. Low-calorie drinks include water and unsweetened drinks. The items listed above may not be a complete list of foods and beverages you can eat. Contact a dietitian for more information. What foods should I limit? Limit foods or drinks that are not good sources of vitamins, minerals, or protein or that are high in unhealthy fats. These   include: Candy. Other sweets. Sodas, specialty coffee drinks, alcohol, and juice. The items listed above may not be a complete list of foods and beverages you should avoid. Contact a dietitian for more information. How do I count calories when eating out? Pay attention to portions. Often, portions are much larger when eating out. Try these tips to keep portions smaller: Consider sharing a meal instead of getting your own. If you get your own meal, eat only half of it. Before you start eating, ask for a container and put half of your meal into it. When available, consider ordering smaller portions from the menu instead of full portions. Pay attention to your food and drink choices. Knowing the way food is cooked and what is included with the meal can help you eat fewer calories. If calories are listed on the menu, choose the lower-calorie options. Choose dishes that include vegetables, fruits, whole grains, low-fat dairy products, and lean proteins. Choose items that are boiled, broiled, grilled, or steamed. Avoid items that are buttered, battered, fried, or served with cream sauce. Items labeled as crispy are usually fried, unless stated otherwise. Choose water, low-fat milk,  unsweetened iced tea, or other drinks without added sugar. If you want an alcoholic beverage, choose a lower-calorie option, such as a glass of wine or light beer. Ask for dressings, sauces, and syrups on the side. These are usually high in calories, so you should limit the amount you eat. If you want a salad, choose a garden salad and ask for grilled meats. Avoid extra toppings such as bacon, cheese, or fried items. Ask for the dressing on the side, or ask for olive oil and vinegar or lemon to use as dressing. Estimate how many servings of a food you are given. Knowing serving sizes will help you be aware of how much food you are eating at restaurants. Where to find more information Centers for Disease Control and Prevention: www.cdc.gov [place] Department of Agriculture: myplate.gov Summary Calorie counting means keeping track of how many calories you eat and drink each day. If you eat fewer calories than your body needs, you should lose weight. A healthy amount of weight to lose per week is usually 1-2 lb (0.5-0.9 kg). This usually means reducing your daily calorie intake by 500-750 calories. The number of calories in a food can be found on a Nutrition Facts label. If a food does not have a Nutrition Facts label, try to look up the calories online or ask your dietitian for help. Use smaller plates, glasses, and bowls for smaller portions and to prevent overeating. Use your calories on foods and drinks that will fill you up and not leave you hungry shortly after a meal. This information is not intended to replace advice given to you by your health care provider. Make sure you discuss any questions you have with your health care provider. Document Revised: 10/03/2019 Document Reviewed: 10/03/2019 Elsevier Patient Education  2023 Elsevier Inc.  

## 2023-08-15 NOTE — Progress Notes (Signed)
[facility]  Jeffery Blackwell, is a 42 y.o. male  [id number]  [id number]  DOB - Jul 20, 1981  No chief complaint on file.      Subjective:   Jeffery Blackwell is a 42 y.o. male here today for a follow up visit. Patient has No headache, No chest pain, No abdominal pain - No Nausea, No new weakness tingling or numbness, No Cough - shortness of breath   No problems updated.  Comprehensive ROS Pertinent positive and negative noted in HPI   Allergies  Allergen Reactions   Ibuprofen Swelling    Past Medical History:  Diagnosis Date   Asthma     Current Outpatient Medications on File Prior to Visit  Medication Sig Dispense Refill   apixaban (ELIQUIS) 5 MG TABS tablet Take 1 tablet (5 mg total) by mouth 2 (two) times daily. 60 tablet 3   No current facility-administered medications on file prior to visit.   Health Maintenance  Topic Date Due   DTaP/Tdap/Td vaccine (1 - Tdap) Never done   Flu Shot  Never done   COVID-19 Vaccine (1 - 2024-25 season) Never done   Hepatitis C Screening  Completed   HIV Screening  Completed   HPV Vaccine  Aged Out    Objective:   Vitals:   08/15/23 1555  BP: 123/81  Pulse: 84  Resp: 16  SpO2: 98%  Weight: (!) 316 lb 12.8 oz (143.7 kg)     Physical Exam Constitutional:      Appearance: He is obese.  HENT:     Right Ear: Tympanic membrane and external ear normal.     Left Ear: Tympanic membrane and external ear normal.     Nose: Nose normal.  Eyes:     Extraocular Movements: Extraocular movements intact.     Pupils: Pupils are equal, round, and reactive to light.  Cardiovascular:     Rate and Rhythm: Normal rate and regular rhythm.  Abdominal:     General: Bowel sounds are normal. There is distension.     Palpations: Abdomen is soft.  Musculoskeletal:        General: Normal range of motion.     Cervical back: Normal range of motion and neck supple.  Skin:    General: Skin is warm and dry.  Neurological:      Mental Status: He is alert and oriented to person, place, and time.  Psychiatric:        Mood and Affect: Mood normal.        Behavior: Behavior normal.     Last lipids Lab Results  Component Value Date   CHOL 208 (H) 12/15/2022   HDL 34 (L) 12/15/2022   LDLCALC 135 (H) 12/15/2022   TRIG 217 (H) 12/15/2022   CHOLHDL 6.1 (H) 12/15/2022      Assessment & Plan  Diagnoses and all orders for this visit:  Mixed hyperlipidemia  Healthy lifestyle diet of fruits vegetables fish nuts whole grains and low saturated fat . Foods high in cholesterol or liver, fatty meats,cheese, butter avocados, nuts and seeds, chocolate and fried foods. -     CMP14+EGFR; Future -     Lipid panel; Future  Acute deep vein thrombosis (DVT) of brachial vein of left upper extremity (HCC) Apixaban (ELIQUIS) 5 MG TABS tablet - completed course   Allergy, initial encounter -     fluticasone (FLONASE) 50 MCG/ACT nasal spray; Place 2 sprays into both nostrils daily. -     levocetirizine (XYZAL) 5 MG  tablet; Take 1 tablet (5 mg total) by mouth every evening.  Prediabetes - educated on lifestyle modifications, including but not limited to diet choices and adding exercise to daily routine.    -     CBC with Differential/Platelet; Future -     Hemoglobin A1c; Future  Other orders -     albuterol (VENTOLIN HFA) 108 (90 Base) MCG/ACT inhaler; Inhale 1-2 puffs into the lungs every 6 (six) hours as needed for wheezing or shortness of breath.      Patient have been counseled extensively about nutrition and exercise. Other issues discussed during this visit include: low cholesterol diet, weight control and daily exercise, foot care, annual eye examinations at Ophthalmology, importance of adherence with medications and regular follow-up. We also discussed long term complications of uncontrolled diabetes and hypertension.   Return in about 2 months (around 10/16/2023) for medical conditions.  The patient was given  clear instructions to go to ER or return to medical center if symptoms don't improve, worsen or new problems develop. The patient verbalized understanding. The patient was told to call to get lab results if they haven't heard anything in the next week.   This note has been created with Education officer, environmental. Any transcriptional errors are unintentional.   Grayce Sessions, NP 08/21/2023, 2:34 PM

## 2023-08-25 ENCOUNTER — Emergency Department (HOSPITAL_BASED_OUTPATIENT_CLINIC_OR_DEPARTMENT_OTHER): Payer: BC Managed Care – PPO

## 2023-08-25 ENCOUNTER — Emergency Department (HOSPITAL_BASED_OUTPATIENT_CLINIC_OR_DEPARTMENT_OTHER)
Admission: EM | Admit: 2023-08-25 | Discharge: 2023-08-25 | Disposition: A | Discharge status: Home / Self Care | Payer: BC Managed Care – PPO | Attending: Emergency Medicine | Admitting: Emergency Medicine

## 2023-08-25 ENCOUNTER — Encounter (HOSPITAL_BASED_OUTPATIENT_CLINIC_OR_DEPARTMENT_OTHER): Payer: Self-pay

## 2023-08-25 ENCOUNTER — Other Ambulatory Visit (HOSPITAL_BASED_OUTPATIENT_CLINIC_OR_DEPARTMENT_OTHER): Payer: Self-pay

## 2023-08-25 ENCOUNTER — Other Ambulatory Visit: Payer: Self-pay

## 2023-08-25 DIAGNOSIS — J45909 Unspecified asthma, uncomplicated: Secondary | ICD-10-CM | POA: Insufficient documentation

## 2023-08-25 DIAGNOSIS — A64 Unspecified sexually transmitted disease: Secondary | ICD-10-CM | POA: Diagnosis not present

## 2023-08-25 DIAGNOSIS — Z7901 Long term (current) use of anticoagulants: Secondary | ICD-10-CM | POA: Insufficient documentation

## 2023-08-25 DIAGNOSIS — Z7951 Long term (current) use of inhaled steroids: Secondary | ICD-10-CM | POA: Diagnosis not present

## 2023-08-25 DIAGNOSIS — R109 Unspecified abdominal pain: Secondary | ICD-10-CM | POA: Diagnosis not present

## 2023-08-25 DIAGNOSIS — A599 Trichomoniasis, unspecified: Secondary | ICD-10-CM | POA: Diagnosis not present

## 2023-08-25 LAB — URINALYSIS, ROUTINE W REFLEX MICROSCOPIC
Bilirubin Urine: NEGATIVE
Glucose, UA: NEGATIVE mg/dL
Ketones, ur: NEGATIVE mg/dL
Nitrite: NEGATIVE
Protein, ur: 30 mg/dL — AB
Specific Gravity, Urine: >=1.03 (ref 1.005–1.030)
pH: 6 (ref 5.0–8.0)

## 2023-08-25 LAB — CBC
HCT: 41.2 % (ref 39.0–52.0)
Hemoglobin: 14.6 g/dL (ref 13.0–17.0)
MCH: 25.8 pg — ABNORMAL LOW (ref 26.0–34.0)
MCHC: 35.4 g/dL (ref 30.0–36.0)
MCV: 72.9 fL — ABNORMAL LOW (ref 80.0–100.0)
Platelets: 289 10*3/uL (ref 150–400)
RBC: 5.65 MIL/uL (ref 4.22–5.81)
RDW: 14.2 % (ref 11.5–15.5)
WBC: 8.3 10*3/uL (ref 4.0–10.5)
nRBC: 0 % (ref 0.0–0.2)

## 2023-08-25 LAB — LIPASE, BLOOD: Lipase: 29 U/L (ref 11–51)

## 2023-08-25 LAB — COMPREHENSIVE METABOLIC PANEL
ALT: 28 U/L (ref 0–44)
AST: 23 U/L (ref 15–41)
Albumin: 3.7 g/dL (ref 3.5–5.0)
Alkaline Phosphatase: 65 U/L (ref 38–126)
Anion gap: 7 (ref 5–15)
BUN: 11 mg/dL (ref 6–20)
CO2: 23 mmol/L (ref 22–32)
Calcium: 8.9 mg/dL (ref 8.9–10.3)
Chloride: 105 mmol/L (ref 98–111)
Creatinine, Ser: 0.83 mg/dL (ref 0.61–1.24)
GFR, Estimated: >60 mL/min (ref >60)
Glucose, Bld: 156 mg/dL — ABNORMAL HIGH (ref 70–99)
Potassium: 3.5 mmol/L (ref 3.5–5.1)
Sodium: 135 mmol/L (ref 135–145)
Total Bilirubin: 0.5 mg/dL (ref <1.2)
Total Protein: 7.4 g/dL (ref 6.5–8.1)

## 2023-08-25 LAB — URINALYSIS, MICROSCOPIC (REFLEX): WBC, UA: >50 WBC/hpf (ref 0–5)

## 2023-08-25 LAB — HIV ANTIBODY (ROUTINE TESTING W REFLEX): HIV Screen 4th Generation wRfx: NONREACTIVE

## 2023-08-25 MED ORDER — METRONIDAZOLE 500 MG PO TABS
500.0000 mg | ORAL_TABLET | Freq: Two times a day (BID) | ORAL | 0 refills | Status: AC
  Filled 2023-08-25: qty 14, 7d supply, fill #0

## 2023-08-25 MED ORDER — DOXYCYCLINE HYCLATE 100 MG PO CAPS
100.0000 mg | ORAL_CAPSULE | Freq: Two times a day (BID) | ORAL | 0 refills | Status: AC
  Filled 2023-08-25: qty 14, 7d supply, fill #0

## 2023-08-25 MED ORDER — ONDANSETRON HCL 4 MG/2ML IJ SOLN
4.0000 mg | Freq: Once | INTRAMUSCULAR | Status: AC
Start: 2023-08-25 — End: 2023-08-25
  Administered 2023-08-25: 4 mg via INTRAVENOUS
  Filled 2023-08-25: qty 2

## 2023-08-25 MED ORDER — CEFTRIAXONE SODIUM 1 G IJ SOLR
1.0000 g | Freq: Once | INTRAMUSCULAR | Status: AC
  Administered 2023-08-25: 1 g via INTRAVENOUS
  Filled 2023-08-25: qty 10

## 2023-08-25 MED ORDER — IOHEXOL 300 MG/ML  SOLN
100.0000 mL | Freq: Once | INTRAMUSCULAR | Status: AC | PRN
  Administered 2023-08-25: 100 mL via INTRAVENOUS

## 2023-08-25 NOTE — ED Notes (Signed)
Patient transported to CT 

## 2023-08-25 NOTE — Discharge Instructions (Addendum)
You were seen today for abdominal pain and penile discharge.  Your urinalysis showed evidence of likely STI as well as trichomonas which is a sexually transmitted illness.  You are being treated empirically for gonorrhea and chlamydia as well.  We cultured your urine to evaluate for possible urinary tract infection, if you develop fever, worsening pain with urination, flank or back pain or abdominal pain you should return to the ED.  You should complete the entire course of antibiotics.  You should notify your sexual partners that they should be tested for STI, and you should not have any sexual activity until you finish antibiotics and your symptoms are resolved.

## 2023-08-25 NOTE — ED Triage Notes (Signed)
Patient here POV from Home.  Endorses ABD cramping/pressure that began 2 days ago. Constant.  Some N/V/D. No recent Fever. No Blood noted in Urine or Stool. Has noted darker urine. Some penile discharge for about 1 month.   NAD Noted during Triage. A&Ox4. GCS 15. Ambulatory.

## 2023-08-25 NOTE — ED Provider Notes (Signed)
[place] [facility] Provider Note   CSN: [id number] Arrival date & time: 08/25/23  1016     History {Add pertinent medical, surgical, social history, OB history to HPI:1} Chief Complaint  Patient presents with   Abdominal Pain    Jeffery Blackwell is a 42 y.o. male.   Abdominal Pain 41 year old male history of asthma presenting for abdominal pain, penile discharge.  He said about a month of penile discharge and some dysuria.  He also some penile pain.  No scrotal or testicular pain.  He is concern for STI and is sexually active.  He is interested in empiric treatment.  He also reports some generalized abdominal pain worsens periumbilical region.  Going on for 2 days.  No vomiting, some nonbloody diarrhea.  No chest pain or shortness of breath.  No fevers or chills.  He is otherwise been at his baseline health.     Home Medications Prior to Admission medications   Medication Sig Start Date End Date Taking? Authorizing Provider  albuterol (VENTOLIN HFA) 108 (90 Base) MCG/ACT inhaler Inhale 1-2 puffs into the lungs every 6 (six) hours as needed for wheezing or shortness of breath. 08/15/23   Grayce Sessions, NP  apixaban (ELIQUIS) 5 MG TABS tablet Take 1 tablet (5 mg total) by mouth 2 (two) times daily. 12/15/22   Grayce Sessions, NP  fluticasone (FLONASE) 50 MCG/ACT nasal spray Place 2 sprays into both nostrils daily. 08/15/23   Grayce Sessions, NP  levocetirizine (XYZAL) 5 MG tablet Take 1 tablet (5 mg total) by mouth every evening. 08/15/23   Grayce Sessions, NP      Allergies    Ibuprofen    Review of Systems   Review of Systems  Gastrointestinal:  Positive for abdominal pain.  Review of systems completed and notable as per HPI.  ROS otherwise negative.   Physical Exam Updated Vital Signs BP 138/83 (BP Location: Right Arm)   Pulse 83   Temp 97.8 F (36.6 C) (Oral)   Resp 18   Ht 6' (1.829 m)   Wt (!) 142.9 kg   SpO2  97%   BMI 42.72 kg/m  Physical Exam Vitals and nursing note reviewed. Exam conducted with a chaperone present.  Constitutional:      General: He is not in acute distress.    Appearance: He is well-developed.  HENT:     Head: Normocephalic and atraumatic.  Eyes:     Extraocular Movements: Extraocular movements intact.     Conjunctiva/sclera: Conjunctivae normal.     Pupils: Pupils are equal, round, and reactive to light.  Cardiovascular:     Rate and Rhythm: Normal rate and regular rhythm.     Pulses: Normal pulses.     Heart sounds: Normal heart sounds. No murmur heard. Pulmonary:     Effort: Pulmonary effort is normal. No respiratory distress.     Breath sounds: Normal breath sounds.  Abdominal:     Palpations: Abdomen is soft.     Tenderness: There is generalized abdominal tenderness and tenderness in the periumbilical area.  Genitourinary:    Penis: Normal.      Testes: Normal.  Musculoskeletal:        General: No swelling.     Cervical back: Neck supple.     Right lower leg: No edema.     Left lower leg: No edema.  Skin:    General: Skin is warm and dry.     Capillary  Refill: Capillary refill takes less than 2 seconds.  Neurological:     Mental Status: He is alert.  Psychiatric:        Mood and Affect: Mood normal.     ED Results / Procedures / Treatments   Labs (all labs ordered are listed, but only abnormal results are displayed) Labs Reviewed  LIPASE, BLOOD  COMPREHENSIVE METABOLIC PANEL  CBC  URINALYSIS, ROUTINE W REFLEX MICROSCOPIC    EKG None  Radiology No results found.  Procedures Procedures  {Document cardiac monitor, telemetry assessment procedure when appropriate:1}  Medications Ordered in ED Medications - No data to display  ED Course/ Medical Decision Making/ A&P   {   Click here for ABCD2, HEART and other calculatorsREFRESH Note before signing :1}                              Medical Decision Making Amount and/or Complexity of  Data Reviewed Labs: ordered.   Medical Decision Making:   Jeffery Blackwell is a 42 y.o. male who presented to the ED today with abdominal pain, penile discharge.  All signs reviewed.  Exam he is well-appearing.  Regarding his penile discharge, he is concern for STI and I am as well.  He is interested in empiric treatment and testing for syphilis and HIV.  Will treat with Rocephin and doxycycline.  His urinalysis has significant bacteriuria as well as trichomonas so we will need Flagyl as well.  He also has some abdominal pain and tenderness.  Obtain CT scan evaluate for possible bili pathology, appendicitis, obstruction.   {crccomplexity:27900} Reviewed and confirmed nursing documentation for past medical history, family history, social history.  Reassessment and Plan:   ***    Patient's presentation is most consistent with {EM COPA:27473}     {Document critical care time when appropriate:1} {Document review of labs and clinical decision tools ie heart score, Chads2Vasc2 etc:1}  {Document your independent review of radiology images, and any outside records:1} {Document your discussion with family members, caretakers, and with consultants:1} {Document social determinants of health affecting pt's care:1} {Document your decision making why or why not admission, treatments were needed:1} Final Clinical Impression(s) / ED Diagnoses Final diagnoses:  None    Rx / DC Orders ED Discharge Orders     None

## 2023-08-26 LAB — URINE CULTURE: Culture: NO GROWTH

## 2023-08-26 LAB — RPR: RPR Ser Ql: NONREACTIVE

## 2023-08-29 LAB — GC/CHLAMYDIA PROBE AMP (~~LOC~~) NOT AT ARMC
Chlamydia: NEGATIVE
Comment: NEGATIVE
Comment: NORMAL
Neisseria Gonorrhea: NEGATIVE

## 2023-10-16 ENCOUNTER — Encounter (INDEPENDENT_AMBULATORY_CARE_PROVIDER_SITE_OTHER): Payer: Self-pay | Admitting: Primary Care

## 2023-10-16 ENCOUNTER — Ambulatory Visit (INDEPENDENT_AMBULATORY_CARE_PROVIDER_SITE_OTHER): Payer: BC Managed Care – PPO | Admitting: Primary Care

## 2023-10-16 VITALS — BP 130/85 | HR 89 | Resp 16 | Ht 72.0 in | Wt 313.6 lb

## 2023-10-16 DIAGNOSIS — Z28310 Unvaccinated for covid-19: Secondary | ICD-10-CM

## 2023-10-16 DIAGNOSIS — Z2821 Immunization not carried out because of patient refusal: Secondary | ICD-10-CM

## 2023-10-16 DIAGNOSIS — Z789 Other specified health status: Secondary | ICD-10-CM | POA: Diagnosis not present

## 2023-10-16 DIAGNOSIS — R9431 Abnormal electrocardiogram [ECG] [EKG]: Secondary | ICD-10-CM | POA: Diagnosis not present

## 2023-10-16 DIAGNOSIS — E782 Mixed hyperlipidemia: Secondary | ICD-10-CM | POA: Diagnosis not present

## 2023-10-16 DIAGNOSIS — R7303 Prediabetes: Secondary | ICD-10-CM

## 2023-10-16 NOTE — Patient Instructions (Signed)
 Nonsurgical Weight Loss Treatment Options Nonsurgical weight loss has many different components. You will learn how to work with a specialized care team to incorporate diet, exercise, and lifestyle changes into your life. To view the content, go to this web address: [email or web address]  This video will expire on: 08/16/2025. If you need access to this video following this date, please reach out to the healthcare provider who assigned it to you. This information is not intended to replace advice given to you by your health care provider. Make sure you discuss any questions you have with your health care provider. Elsevier Patient Education  2024 [employer].

## 2023-10-16 NOTE — Progress Notes (Signed)
 [facility]  Jeffery Blackwell, is a 43 y.o. male  VWU:[id number]  GNF:[id number]  DOB - 1981-08-11  Chief Complaint  Patient presents with   Weight Management Screening       Subjective:   Jeffery Blackwell is a 43 y.o. male here today for a follow up visit. Patient has No headache, No chest pain, No abdominal pain - No Nausea, No new weakness tingling or numbness, No Cough - shortness of breath HPI  No problems updated.  Comprehensive ROS Pertinent positive and negative noted in HPI   Allergies  Allergen Reactions   Ibuprofen Swelling    Past Medical History:  Diagnosis Date   Asthma     Current Outpatient Medications on File Prior to Visit  Medication Sig Dispense Refill   albuterol  (VENTOLIN  HFA) 108 (90 Base) MCG/ACT inhaler Inhale 1-2 puffs into the lungs every 6 (six) hours as needed for wheezing or shortness of breath. 6.7 g 1   apixaban  (ELIQUIS ) 5 MG TABS tablet Take 1 tablet (5 mg total) by mouth 2 (two) times daily. 60 tablet 3   fluticasone  (FLONASE ) 50 MCG/ACT nasal spray Place 2 sprays into both nostrils daily. 16 g 6   levocetirizine (XYZAL ) 5 MG tablet Take 1 tablet (5 mg total) by mouth every evening. 90 tablet 2   No current facility-administered medications on file prior to visit.   Health Maintenance  Topic Date Due   COVID-19 Vaccine (1 - 2024-25 season) 11/01/2023*   Flu Shot  12/04/2023*   DTaP/Tdap/Td vaccine (1 - Tdap) 10/15/2024*   Pneumococcal Vaccination (1 of 2 - PCV) 10/15/2024*   Hepatitis C Screening  Completed   HIV Screening  Completed   HPV Vaccine  Aged Out  *Topic was postponed. The date shown is not the original due date.    Objective:   Vitals:   10/16/23 1511  BP: 130/85  Pulse: 89  Resp: 16  SpO2: 98%  Weight: (!) 313 lb 9.6 oz (142.2 kg)  Height: 6' (1.829 m)   BP Readings from Last 3 Encounters:  10/16/23 130/85  08/25/23 130/85  08/15/23 123/81      Physical Exam Vitals reviewed.   Constitutional:      Appearance: He is obese.  HENT:     Head: Normocephalic.     Right Ear: Tympanic membrane and external ear normal.     Left Ear: Tympanic membrane and external ear normal.  Eyes:     Extraocular Movements: Extraocular movements intact.     Pupils: Pupils are equal, round, and reactive to light.  Cardiovascular:     Rate and Rhythm: Normal rate and regular rhythm.  Pulmonary:     Effort: Pulmonary effort is normal.     Breath sounds: Normal breath sounds.  Abdominal:     General: Bowel sounds are normal. There is distension.     Palpations: Abdomen is soft.  Musculoskeletal:        General: Normal range of motion.     Cervical back: Normal range of motion and neck supple.  Skin:    General: Skin is warm and dry.  Neurological:     Mental Status: He is alert and oriented to person, place, and time.  Psychiatric:        Mood and Affect: Mood normal.        Behavior: Behavior normal.        Thought Content: Thought content normal.        Judgment: Judgment normal.  Assessment & Plan  Influenza vaccination declined  Mixed hyperlipidemia -     Lipid panel -     CMP14+EGFR  Prediabetes -     Hemoglobin A1c -     CBC with Differential/Platelet  Tetanus, diphtheria, and acellular pertussis (Tdap) vaccination declined  COVID-19 vaccine series declined  Educated about management of weight     Patient have been counseled extensively about nutrition and exercise. Other issues discussed during this visit include: low cholesterol diet, weight control and daily exercise, foot care, annual eye examinations at Ophthalmology, importance of adherence with medications and regular follow-up. We also discussed long term complications of uncontrolled diabetes and hypertension.   No follow-ups on file.  The patient was given clear instructions to go to ER or return to medical center if symptoms don't improve, worsen or new problems develop. The patient  verbalized understanding. The patient was told to call to get lab results if they haven't heard anything in the next week.   This note has been created with Education officer, environmental. Any transcriptional errors are unintentional.   Jeffery Siemens, NP 10/16/2023, 3:29 PM

## 2023-10-17 LAB — CMP14+EGFR
ALT: 31 [IU]/L (ref 0–44)
AST: 25 [IU]/L (ref 0–40)
Albumin: 4.3 g/dL (ref 4.1–5.1)
Alkaline Phosphatase: 99 [IU]/L (ref 44–121)
BUN/Creatinine Ratio: 17 (ref 9–20)
BUN: 13 mg/dL (ref 6–24)
Bilirubin Total: 0.3 mg/dL (ref 0.0–1.2)
CO2: 23 mmol/L (ref 20–29)
Calcium: 9.5 mg/dL (ref 8.7–10.2)
Chloride: 100 mmol/L (ref 96–106)
Creatinine, Ser: 0.75 mg/dL — ABNORMAL LOW (ref 0.76–1.27)
Globulin, Total: 3.1 g/dL (ref 1.5–4.5)
Glucose: 162 mg/dL — ABNORMAL HIGH (ref 70–99)
Potassium: 4.6 mmol/L (ref 3.5–5.2)
Sodium: 140 mmol/L (ref 134–144)
Total Protein: 7.4 g/dL (ref 6.0–8.5)
eGFR: 116 mL/min/{1.73_m2} (ref >59)

## 2023-10-17 LAB — CBC WITH DIFFERENTIAL/PLATELET
Basophils Absolute: 0 10*3/uL (ref 0.0–0.2)
Basos: 0 %
EOS (ABSOLUTE): 0 10*3/uL (ref 0.0–0.4)
Eos: 1 %
Hematocrit: 46.1 % (ref 37.5–51.0)
Hemoglobin: 15.7 g/dL (ref 13.0–17.7)
Immature Grans (Abs): 0 10*3/uL (ref 0.0–0.1)
Immature Granulocytes: 0 %
Lymphocytes Absolute: 1.2 10*3/uL (ref 0.7–3.1)
Lymphs: 15 %
MCH: 26.7 pg (ref 26.6–33.0)
MCHC: 34.1 g/dL (ref 31.5–35.7)
MCV: 79 fL (ref 79–97)
Monocytes Absolute: 0.4 10*3/uL (ref 0.1–0.9)
Monocytes: 4 %
Neutrophils Absolute: 6.8 10*3/uL (ref 1.4–7.0)
Neutrophils: 80 %
Platelets: 291 10*3/uL (ref 150–450)
RBC: 5.87 x10E6/uL — ABNORMAL HIGH (ref 4.14–5.80)
RDW: 15.8 % — ABNORMAL HIGH (ref 11.6–15.4)
WBC: 8.5 10*3/uL (ref 3.4–10.8)

## 2023-10-17 LAB — HEMOGLOBIN A1C
Est. average glucose Bld gHb Est-mCnc: 134 mg/dL
Hgb A1c MFr Bld: 6.3 % — ABNORMAL HIGH (ref 4.8–5.6)

## 2023-10-17 LAB — LIPID PANEL
Chol/HDL Ratio: 7.1 {ratio} — ABNORMAL HIGH (ref 0.0–5.0)
Cholesterol, Total: 221 mg/dL — ABNORMAL HIGH (ref 100–199)
HDL: 31 mg/dL — ABNORMAL LOW (ref >39)
LDL Chol Calc (NIH): 153 mg/dL — ABNORMAL HIGH (ref 0–99)
Triglycerides: 199 mg/dL — ABNORMAL HIGH (ref 0–149)
VLDL Cholesterol Cal: 37 mg/dL (ref 5–40)

## 2023-10-18 ENCOUNTER — Other Ambulatory Visit (INDEPENDENT_AMBULATORY_CARE_PROVIDER_SITE_OTHER): Payer: Self-pay | Admitting: Primary Care

## 2023-10-18 ENCOUNTER — Encounter (INDEPENDENT_AMBULATORY_CARE_PROVIDER_SITE_OTHER): Payer: Self-pay | Admitting: Primary Care

## 2023-10-18 MED ORDER — ROSUVASTATIN CALCIUM 20 MG PO TABS: 20.0000 mg | ORAL_TABLET | Freq: Every day | ORAL | 1 refills | Status: DC

## 2023-10-25 ENCOUNTER — Other Ambulatory Visit: Payer: Self-pay

## 2023-10-25 ENCOUNTER — Encounter (HOSPITAL_COMMUNITY): Payer: Self-pay | Admitting: Emergency Medicine

## 2023-10-25 ENCOUNTER — Emergency Department (HOSPITAL_COMMUNITY): Payer: BC Managed Care – PPO

## 2023-10-25 ENCOUNTER — Emergency Department (HOSPITAL_COMMUNITY)
Admission: EM | Admit: 2023-10-25 | Discharge: 2023-10-25 | Disposition: A | Discharge status: Home / Self Care | Payer: BC Managed Care – PPO | Attending: Emergency Medicine | Admitting: Emergency Medicine

## 2023-10-25 DIAGNOSIS — M545 Low back pain, unspecified: Secondary | ICD-10-CM | POA: Diagnosis not present

## 2023-10-25 DIAGNOSIS — M549 Dorsalgia, unspecified: Secondary | ICD-10-CM | POA: Diagnosis not present

## 2023-10-25 DIAGNOSIS — Y92410 Unspecified street and highway as the place of occurrence of the external cause: Secondary | ICD-10-CM | POA: Insufficient documentation

## 2023-10-25 DIAGNOSIS — S0990XA Unspecified injury of head, initial encounter: Secondary | ICD-10-CM | POA: Diagnosis not present

## 2023-10-25 DIAGNOSIS — J45909 Unspecified asthma, uncomplicated: Secondary | ICD-10-CM | POA: Diagnosis not present

## 2023-10-25 DIAGNOSIS — F17200 Nicotine dependence, unspecified, uncomplicated: Secondary | ICD-10-CM | POA: Insufficient documentation

## 2023-10-25 DIAGNOSIS — R519 Headache, unspecified: Secondary | ICD-10-CM | POA: Diagnosis not present

## 2023-10-25 DIAGNOSIS — M25572 Pain in left ankle and joints of left foot: Secondary | ICD-10-CM | POA: Diagnosis not present

## 2023-10-25 DIAGNOSIS — M542 Cervicalgia: Secondary | ICD-10-CM | POA: Diagnosis not present

## 2023-10-25 DIAGNOSIS — M546 Pain in thoracic spine: Secondary | ICD-10-CM | POA: Diagnosis not present

## 2023-10-25 DIAGNOSIS — M4854XA Collapsed vertebra, not elsewhere classified, thoracic region, initial encounter for fracture: Secondary | ICD-10-CM | POA: Diagnosis not present

## 2023-10-25 DIAGNOSIS — Z7901 Long term (current) use of anticoagulants: Secondary | ICD-10-CM | POA: Insufficient documentation

## 2023-10-25 DIAGNOSIS — M25551 Pain in right hip: Secondary | ICD-10-CM | POA: Diagnosis not present

## 2023-10-25 DIAGNOSIS — I10 Essential (primary) hypertension: Secondary | ICD-10-CM | POA: Diagnosis not present

## 2023-10-25 DIAGNOSIS — S199XXA Unspecified injury of neck, initial encounter: Secondary | ICD-10-CM | POA: Diagnosis not present

## 2023-10-25 MED ORDER — OXYCODONE-ACETAMINOPHEN 5-325 MG PO TABS
1.0000 | ORAL_TABLET | Freq: Once | ORAL | Status: AC
  Administered 2023-10-25: 1 via ORAL
  Filled 2023-10-25: qty 1

## 2023-10-25 MED ORDER — METHOCARBAMOL 500 MG PO TABS: 500.0000 mg | ORAL_TABLET | Freq: Two times a day (BID) | ORAL | 0 refills | Status: DC | PRN

## 2023-10-25 MED ORDER — METHOCARBAMOL 500 MG PO TABS
500.0000 mg | ORAL_TABLET | Freq: Once | ORAL | Status: AC
  Administered 2023-10-25: 500 mg via ORAL
  Filled 2023-10-25: qty 1
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
## 2023-11-16 ENCOUNTER — Ambulatory Visit (INDEPENDENT_AMBULATORY_CARE_PROVIDER_SITE_OTHER)

## 2023-11-16 ENCOUNTER — Ambulatory Visit (HOSPITAL_COMMUNITY)
Admission: RE | Admit: 2023-11-16 | Discharge: 2023-11-16 | Disposition: A | Discharge status: Home / Self Care | Source: Ambulatory Visit | Attending: Internal Medicine | Admitting: Internal Medicine

## 2023-11-16 ENCOUNTER — Ambulatory Visit
Admission: EM | Admit: 2023-11-16 | Discharge: 2023-11-16 | Disposition: A | Discharge status: Home / Self Care | Attending: Internal Medicine | Admitting: Internal Medicine

## 2023-11-16 ENCOUNTER — Encounter: Payer: Self-pay | Admitting: Emergency Medicine

## 2023-11-16 DIAGNOSIS — M79605 Pain in left leg: Secondary | ICD-10-CM | POA: Diagnosis not present

## 2023-11-16 DIAGNOSIS — M79662 Pain in left lower leg: Secondary | ICD-10-CM | POA: Insufficient documentation

## 2023-11-16 MED ORDER — METHOCARBAMOL 500 MG PO TABS: 500.0000 mg | ORAL_TABLET | Freq: Two times a day (BID) | ORAL | 0 refills | Status: AC | PRN

## 2023-11-16 NOTE — Progress Notes (Signed)
 Left lower venous duplex done. No evidence of DVT or SVT. Preliminary report can be found under the CV tab.  Dondra Prader RVT RCS
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
## 2023-11-16 NOTE — Discharge Instructions (Addendum)
 I will call if x-ray or ultrasound is abnormal.  I have prescribed you a few more pills of muscle relaxer to see if this will be helpful.  Follow-up with orthopedist if pain persists or worsens and imaging is normal.
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
## 2023-11-18 ENCOUNTER — Emergency Department (HOSPITAL_BASED_OUTPATIENT_CLINIC_OR_DEPARTMENT_OTHER)
Admission: EM | Admit: 2023-11-18 | Discharge: 2023-11-18 | Disposition: A | Discharge status: Home / Self Care | Attending: Emergency Medicine | Admitting: Emergency Medicine

## 2023-11-18 ENCOUNTER — Other Ambulatory Visit: Payer: Self-pay

## 2023-11-18 ENCOUNTER — Encounter (HOSPITAL_BASED_OUTPATIENT_CLINIC_OR_DEPARTMENT_OTHER): Payer: Self-pay | Admitting: Emergency Medicine

## 2023-11-18 ENCOUNTER — Other Ambulatory Visit (HOSPITAL_BASED_OUTPATIENT_CLINIC_OR_DEPARTMENT_OTHER): Payer: Self-pay

## 2023-11-18 DIAGNOSIS — S86912A Strain of unspecified muscle(s) and tendon(s) at lower leg level, left leg, initial encounter: Secondary | ICD-10-CM | POA: Diagnosis not present

## 2023-11-18 DIAGNOSIS — S8992XA Unspecified injury of left lower leg, initial encounter: Secondary | ICD-10-CM | POA: Diagnosis not present

## 2023-11-18 DIAGNOSIS — S86819A Strain of other muscle(s) and tendon(s) at lower leg level, unspecified leg, initial encounter: Secondary | ICD-10-CM

## 2023-11-18 DIAGNOSIS — Y92410 Unspecified street and highway as the place of occurrence of the external cause: Secondary | ICD-10-CM | POA: Insufficient documentation

## 2023-11-18 DIAGNOSIS — S86112A Strain of other muscle(s) and tendon(s) of posterior muscle group at lower leg level, left leg, initial encounter: Secondary | ICD-10-CM | POA: Insufficient documentation

## 2023-11-18 DIAGNOSIS — S76312A Strain of muscle, fascia and tendon of the posterior muscle group at thigh level, left thigh, initial encounter: Secondary | ICD-10-CM | POA: Diagnosis not present

## 2023-11-18 MED ORDER — KETOROLAC TROMETHAMINE 10 MG PO TABS: 10.0000 mg | ORAL_TABLET | Freq: Four times a day (QID) | ORAL | 0 refills | Status: AC | PRN

## 2023-11-18 MED ORDER — FAMOTIDINE 20 MG PO TABS: 20.0000 mg | ORAL_TABLET | Freq: Two times a day (BID) | ORAL | 0 refills | Status: AC

## 2023-11-18 MED ORDER — KETOROLAC TROMETHAMINE 60 MG/2ML IM SOLN
30.0000 mg | Freq: Once | INTRAMUSCULAR | Status: AC
  Administered 2023-11-18: 30 mg via INTRAMUSCULAR
  Filled 2023-11-18: qty 2

## 2023-11-18 MED ORDER — KETOROLAC TROMETHAMINE 10 MG PO TABS
10.0000 mg | ORAL_TABLET | Freq: Four times a day (QID) | ORAL | 0 refills | Status: DC | PRN
  Filled 2023-11-18: qty 20, 5d supply, fill #0

## 2023-11-18 MED ORDER — FAMOTIDINE 20 MG PO TABS
20.0000 mg | ORAL_TABLET | Freq: Two times a day (BID) | ORAL | 0 refills | Status: DC
  Filled 2023-11-18: qty 30, 15d supply, fill #0

## 2023-11-18 NOTE — ED Notes (Signed)
 Pt alert and oriented X 4 at the time of discharge. RR even and unlabored. No acute distress noted. Pt verbalized understanding of discharge instructions as discussed. Pt ambulatory to lobby at time of discharge.
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
## 2023-11-18 NOTE — ED Provider Notes (Signed)
 [place] [facility] Provider Note   CSN: [id number] Arrival date & time: 11/18/23  0944     History  Chief Complaint  Patient presents with   Leg Pain    left    Jeffery Blackwell is a 43 y.o. male.  This is a pleasant 43 year old male who is here today with leg pain that is been going on for a few days.  Patient was seen at an urgent care 2 days ago, had an x-ray and a DVT ultrasound done both of which were negative.  Patient Dors is pain behind his left knee, which is worse when he straightens his leg.  He describes a tightness going up into his posterior thigh and into his calf.   Leg Pain      Home Medications Prior to Admission medications   Medication Sig Start Date End Date Taking? Authorizing Provider  albuterol (VENTOLIN HFA) 108 (90 Base) MCG/ACT inhaler Inhale 1-2 puffs into the lungs every 6 (six) hours as needed for wheezing or shortness of breath. 08/15/23   Grayce Sessions, NP  apixaban (ELIQUIS) 5 MG TABS tablet Take 1 tablet (5 mg total) by mouth 2 (two) times daily. Patient not taking: Reported on 11/16/2023 12/15/22   Grayce Sessions, NP  fluticasone [facility]) 50 MCG/ACT nasal spray Place 2 sprays into both nostrils daily. Patient not taking: Reported on 11/16/2023 08/15/23   Grayce Sessions, NP  levocetirizine (XYZAL) 5 MG tablet Take 1 tablet (5 mg total) by mouth every evening. Patient not taking: Reported on 11/16/2023 08/15/23   Grayce Sessions, NP  methocarbamol (ROBAXIN) 500 MG tablet Take 1 tablet (500 mg total) by mouth 2 (two) times daily as needed for muscle spasms. 11/16/23   Gustavus Bryant, FNP  rosuvastatin (CRESTOR) 20 MG tablet Take 1 tablet (20 mg total) by mouth daily. 10/18/23   Grayce Sessions, NP      Allergies    Ibuprofen    Review of Systems   Review of Systems  Physical Exam Updated Vital Signs BP (!) 147/105   Pulse 69   Temp 97.9 F (36.6 C) (Oral)   Resp 18   Wt (!)  140.6 kg   SpO2 92%   BMI 42.04 kg/m  Physical Exam Vitals reviewed.  Musculoskeletal:        General: No swelling. Normal range of motion.     Left lower leg: No edema.     Comments: Patient with tenderness in the popliteal muscle, belly of the gastrocnemius  Neurological:     General: No focal deficit present.     Mental Status: He is alert.     ED Results / Procedures / Treatments   Labs (all labs ordered are listed, but only abnormal results are displayed) Labs Reviewed - No data to display  EKG None  Radiology VAS [place] LOWER EXTREMITY VENOUS (DVT) Result Date: 11/16/2023  Lower Venous DVT Study Patient Name:  ERMIN PARISIEN  Date of Exam:   11/16/2023 Medical Rec #: [id number]       Accession #:    [id number] Date of Birth: Jul 04, 1981       Patient Gender: M Patient Age:   36 years Exam Location:  [facility] Procedure:      VAS [place] LOWER EXTREMITY VENOUS (DVT) Referring Phys: HALEY MOUND --------------------------------------------------------------------------------  Indications: Pain. Other Indications: Complains of left calf pain for the past four days. Patient  denies SOB. Risk Factors: None identified. Performing Technologist: Dondra Prader RVT RCS  Examination Guidelines: A complete evaluation includes B-mode imaging, spectral Doppler, color Doppler, and power Doppler as needed of all accessible portions of each vessel. Bilateral testing is considered an integral part of a complete examination. Limited examinations for reoccurring indications may be performed as noted. The reflux portion of the exam is performed with the patient in reverse Trendelenburg.  +-----+---------------+---------+-----------+----------+--------------+ RIGHTCompressibilityPhasicitySpontaneityPropertiesThrombus Aging +-----+---------------+---------+-----------+----------+--------------+ CFV  Full           Yes      Yes                                  +-----+---------------+---------+-----------+----------+--------------+   +---------+---------------+---------+-----------+----------+--------------+ LEFT     CompressibilityPhasicitySpontaneityPropertiesThrombus Aging +---------+---------------+---------+-----------+----------+--------------+ CFV      Full           Yes      Yes                                 +---------+---------------+---------+-----------+----------+--------------+ SFJ      Full           Yes      Yes                                 +---------+---------------+---------+-----------+----------+--------------+ FV Prox  Full           Yes      Yes                                 +---------+---------------+---------+-----------+----------+--------------+ FV Mid   Full           Yes      Yes                                 +---------+---------------+---------+-----------+----------+--------------+ FV DistalFull           Yes      Yes                                 +---------+---------------+---------+-----------+----------+--------------+ PFV      Full           Yes      Yes                                 +---------+---------------+---------+-----------+----------+--------------+ POP      Full           Yes      Yes                                 +---------+---------------+---------+-----------+----------+--------------+ PTV      Full           Yes      Yes                                 +---------+---------------+---------+-----------+----------+--------------+ PERO     Full  Yes      Yes                                 +---------+---------------+---------+-----------+----------+--------------+ GSV      Full           Yes      Yes                                 +---------+---------------+---------+-----------+----------+--------------+    Summary: RIGHT: - No evidence of common femoral vein obstruction.  LEFT: - No evidence of deep vein thrombosis in the lower  extremity. No indirect evidence of obstruction proximal to the inguinal ligament.  - No cystic structure found in the popliteal fossa.  *See table(s) above for measurements and observations. Electronically signed by Sherald Hess MD on 11/16/2023 at 10:05:54 PM.    Final    DG Tibia/Fibula Left Result Date: 11/16/2023 CLINICAL DATA:  Leg pain EXAM: LEFT TIBIA AND FIBULA - 2 VIEW COMPARISON:  None Available. FINDINGS: There is no evidence of fracture or other focal bone lesions. Soft tissues are unremarkable. IMPRESSION: Negative. Electronically Signed   By: Charlett Nose M.D.   On: 11/16/2023 15:38    Procedures Procedures    Medications Ordered in ED Medications  ketorolac (TORADOL) injection 30 mg (has no administration in time range)    ED Course/ Medical Decision Making/ A&P                                 Medical Decision Making 43 year old male here today with left lower extremity pain.  Plan-on exam, patient appears of a musculoskeletal injury.  It is reproducible, provokable.  Likely strain of the gastrocnemius, popliteal muscle, biceps femoris.  Reviewed the patient's urgent care note, no evidence of a popliteal cyst, lower suspicion for DVT.  Patient has not tried any NSAID class medications.  Will provide him some Toradol.  Patient says that he has had a reaction to ibuprofen in the past, so we will provide him with a dose of Toradol here and monitor.  Reassessment 1130-patient with no issues Toradol.  Will discharge with prescription.  Risk Prescription drug management.           Final Clinical Impression(s) / ED Diagnoses Final diagnoses:  None    Rx / DC Orders ED Discharge Orders     None         Arletha Pili, DO 11/18/23 1132

## 2023-11-18 NOTE — Discharge Instructions (Addendum)
 You have strained your upper calf muscle, your popliteal muscle, and your hamstring.  You can take Toradol 4 times per day for the next 5 days.  You can also take famotidine with this medicine.  Heat can be helpful to loosen up the muscle.  Do some gentle stretching.  Symptoms should begin to improve over the next 1 week.  Follow-up with your PCP.

## 2023-11-20 ENCOUNTER — Other Ambulatory Visit (HOSPITAL_BASED_OUTPATIENT_CLINIC_OR_DEPARTMENT_OTHER): Payer: Self-pay

## 2023-11-21 ENCOUNTER — Other Ambulatory Visit (INDEPENDENT_AMBULATORY_CARE_PROVIDER_SITE_OTHER): Payer: Self-pay

## 2023-11-21 ENCOUNTER — Encounter: Payer: Self-pay | Admitting: Physician Assistant

## 2023-11-21 ENCOUNTER — Ambulatory Visit (INDEPENDENT_AMBULATORY_CARE_PROVIDER_SITE_OTHER): Admitting: Physician Assistant

## 2023-11-21 DIAGNOSIS — M79605 Pain in left leg: Secondary | ICD-10-CM | POA: Insufficient documentation

## 2023-11-21 MED ORDER — METHYLPREDNISOLONE 4 MG PO TBPK
ORAL_TABLET | ORAL | 0 refills | Status: AC
Start: 2023-11-21 — End: ?
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
## 2023-12-05 ENCOUNTER — Ambulatory Visit: Admitting: Physician Assistant

## 2023-12-13 ENCOUNTER — Ambulatory Visit: Payer: BC Managed Care – PPO | Admitting: Internal Medicine

## 2023-12-14 ENCOUNTER — Encounter: Payer: Self-pay | Admitting: Cardiology

## 2023-12-14 ENCOUNTER — Ambulatory Visit: Attending: Cardiology | Admitting: Cardiology

## 2023-12-14 VITALS — BP 124/88 | HR 73 | Ht 72.0 in | Wt 300.6 lb

## 2023-12-14 DIAGNOSIS — Z9189 Other specified personal risk factors, not elsewhere classified: Secondary | ICD-10-CM | POA: Diagnosis not present

## 2023-12-14 DIAGNOSIS — E7800 Pure hypercholesterolemia, unspecified: Secondary | ICD-10-CM | POA: Diagnosis not present

## 2023-12-14 MED ORDER — ROSUVASTATIN CALCIUM 20 MG PO TABS: 20.0000 mg | ORAL_TABLET | Freq: Every day | ORAL | 3 refills | Status: AC

## 2023-12-14 NOTE — Addendum Note (Signed)
 Addended by: Bertram Millard on: 12/14/2023 02:14 PM   Modules accepted: Orders

## 2023-12-14 NOTE — Patient Instructions (Signed)
 Medication Instructions:  Restart Rosuvastatin 20 mg a day   *If you need a refill on your cardiac medications before your next appointment, please call your pharmacy*  Lab Work:  Lipid and lipo a  and alt in 6 weeks   If you have labs (blood work) drawn today and your tests are completely normal, you will receive your results only by: MyChart Message (if you have MyChart) OR A paper copy in the mail If you have any lab test that is abnormal or we need to change your treatment, we will call you to review the results.  Testing/Procedures:  Calcium Score   Follow-Up: At [facility], you and your health needs are our priority.  As part of our continuing mission to provide you with exceptional heart care, our providers are all part of one team.  This team includes your primary Cardiologist (physician) and Advanced Practice Providers or APPs (Physician Assistants and Nurse Practitioners) who all work together to provide you with the care you need, when you need it.  Your next appointment:  as needed We recommend signing up for the patient portal called "MyChart".  Sign up information is provided on this After Visit Summary.  MyChart is used to connect with patients for Virtual Visits (Telemedicine).  Patients are able to view lab/test results, encounter notes, upcoming appointments, etc.  Non-urgent messages can be sent to your provider as well.   To learn more about what you can do with MyChart, go to [email or web address].   Other Instructions       1st Floor: - Lobby - Registration  - Pharmacy  - Lab - Cafe  2nd Floor: - PV Lab - Diagnostic Testing (echo, CT, nuclear med)  3rd Floor: - Vacant  4th Floor: - TCTS (cardiothoracic surgery) - AFib Clinic - Structural Heart Clinic - Vascular Surgery  - Vascular Ultrasound  5th Floor: - HeartCare Cardiology (general and EP) - Clinical Pharmacy for coumadin, hypertension, lipid, weight-loss medications, and med  management appointments    Valet parking services will be available as well.

## 2023-12-14 NOTE — Progress Notes (Signed)
 Cardiology CONSULT Note    Date:  12/14/2023   ID:  Kenyen Blackwell, DOB 1981/07/28, MRN [id number]  PCP:  Grayce Sessions, NP  Cardiologist:  Armanda Magic, MD   Chief Complaint  Patient presents with   New Patient (Initial Visit)    Risk factors for CAD    Patient Profile: [name] is a 43 y.o. male who is being seen today for the evaluation of abnormal EKG at the request of Grayce Sessions, NP.  History of Present Illness:  Jeffery Blackwell is a 43 y.o. male who is being seen today for the evaluation of cardiac risk doctors for CAD at the request of Grayce Sessions, NP.  This is a 43 year old male with a history of asthma, prediabetes and hyperlipidemia who was referred by his primary care physician for evaluation for cardiac risk factors for CAD he denies any chest pain, pressure, shortness of breath, PND, orthopnea, lower extremity edema, dizziness, palpitations or syncope.  He smokes 1 pack every 4 days of cigarettes, social ETOH.    Past Medical History:  Diagnosis Date   Asthma     Past Surgical History:  Procedure Laterality Date   FOOT SURGERY Right     Current Medications: No outpatient medications have been marked as taking for the 12/14/23 encounter (Office Visit) with Quintella Reichert, MD.    Allergies:   Ibuprofen   Social History   Socioeconomic History   Marital status: Single    Spouse name: Not on file   Number of children: Not on file   Years of education: Not on file   Highest education level: Some college, no degree  Occupational History   Not on file  Tobacco Use   Smoking status: Never   Smokeless tobacco: Never  Vaping Use   Vaping status: Never Used  Substance and Sexual Activity   Alcohol use: Yes    Comment: Occ   Drug use: Never   Sexual activity: Yes    Birth control/protection: None  Other Topics Concern   Not on file  Social History Narrative   Not on file   Social Drivers of Health   Financial Resource  Strain: High Risk (10/12/2023)   Overall Financial Resource Strain (CARDIA)    Difficulty of Paying Living Expenses: Very hard  Food Insecurity: Food Insecurity Present (10/12/2023)   Hunger Vital Sign    Worried About Running Out of Food in the Last Year: Often true    Ran Out of Food in the Last Year: Often true  Transportation Needs: No Transportation Needs (10/12/2023)   PRAPARE - Administrator, Civil Service (Medical): No    Lack of Transportation (Non-Medical): No  Physical Activity: Unknown (10/12/2023)   Exercise Vital Sign    Days of Exercise per Week: 0 days    Minutes of Exercise per Session: Not on file  Stress: Stress Concern Present (10/12/2023)   [employer] of Occupational Health - Occupational Stress Questionnaire    Feeling of Stress : To some extent  Social Connections: Moderately Integrated (10/12/2023)   Social Connection and Isolation Panel [NHANES]    Frequency of Communication with Friends and Family: More than three times a week    Frequency of Social Gatherings with Friends and Family: Twice a week    Attends Religious Services: 1 to 4 times per year    Active Member of [employer] or Organizations: Yes    Attends Banker Meetings: 1  to 4 times per year    Marital Status: Never married     Family History:  The patient's family history includes Healthy in his father.   ROS:   Please see the history of present illness.    ROS All other systems reviewed and are negative.      No data to display             PHYSICAL EXAM:   VS:  BP 124/88   Pulse 73   Ht 6' (1.829 m)   Wt (!) 300 lb 9.6 oz (136.4 kg)   SpO2 98%   BMI 40.77 kg/m    GEN: Well nourished, well developed, in no acute distress  HEENT: normal  Neck: no JVD, carotid bruits, or masses Cardiac: RRR; no murmurs, rubs, or gallops,no edema.  Intact distal pulses bilaterally.  Respiratory:  clear to auscultation bilaterally, normal work of breathing GI: soft, nontender,  nondistended, + BS MS: no deformity or atrophy  Skin: warm and dry, no rash Neuro:  Alert and Oriented x 3, Strength and sensation are intact Psych: euthymic mood, full affect  Wt Readings from Last 3 Encounters:  12/14/23 (!) 300 lb 9.6 oz (136.4 kg)  11/18/23 (!) 310 lb (140.6 kg)  10/25/23 300 lb (136.1 kg)      Studies/Labs Reviewed:   Recent Labs: 10/16/2023: ALT 31; BUN 13; Creatinine, Ser 0.75; Hemoglobin 15.7; Platelets 291; Potassium 4.6; Sodium 140   Lipid Panel    Component Value Date/Time   CHOL 221 (H) 10/16/2023 1502   TRIG 199 (H) 10/16/2023 1502   HDL 31 (L) 10/16/2023 1502   CHOLHDL 7.1 (H) 10/16/2023 1502   LDLCALC 153 (H) 10/16/2023 1502    Additional studies/ records that were reviewed today include:  Office notes by PCP and EKG 10/16/2023    ASSESSMENT:    1. Multiple risk factors for coronary artery disease   2. Pure hypercholesterolemia      PLAN:  In order of problems listed above:  #Cardiac risk factors for CAD -He has a history of prediabetes, HLD, obesity, tobacco use ongoing and hypertension.  Twelve-lead EKG done 10/16/2023 by PCP showed normal sinus rhythm with nonspecific T wave abnormality -He is completely asymptomatic from a cardiac standpoint -Check a coronary calcium score to assess future cardiac risk  #HLD -I have personally reviewed and interpreted outside labs performed by patient's PCP which showed LDL 153, HDL 31, TAG 199 on 10/16/2023 -He was started on Crestor 20mg  daily and he finished the prescription that had no refills and has not been on it for a while -I will refill Crestor 20mg  daily -if he is found to have Coronary Ca then he will need to have a goal of < 70 -repeat FLP and ALT and Lp(a)  in 6 weeks  Time Spent: 20 minutes total time of encounter, including 15 minutes spent in face-to-face patient care on the date of this encounter. This time includes coordination of care and counseling regarding above mentioned  problem list. Remainder of non-face-to-face time involved reviewing chart documents/testing relevant to the patient encounter and documentation in the medical record. I have independently reviewed documentation from referring provider  Followup:  PRN  Medication Adjustments/Labs and Tests Ordered: Current medicines are reviewed at length with the patient today.  Concerns regarding medicines are outlined above.  Medication changes, Labs and Tests ordered today are listed in the Patient Instructions below.  There are no Patient Instructions on file for this visit.  Signed, Armanda Magic, MD  12/14/2023 2:05 PM    [facility] [address] [place], [place], [place]  [address] Phone: [phone]; Fax: [phone]

## 2023-12-19 ENCOUNTER — Inpatient Hospital Stay (HOSPITAL_COMMUNITY): Admission: RE | Admit: 2023-12-19 | Source: Ambulatory Visit

## 2024-05-10 ENCOUNTER — Emergency Department (HOSPITAL_BASED_OUTPATIENT_CLINIC_OR_DEPARTMENT_OTHER)

## 2024-05-10 ENCOUNTER — Emergency Department (HOSPITAL_BASED_OUTPATIENT_CLINIC_OR_DEPARTMENT_OTHER)
Admission: EM | Admit: 2024-05-10 | Discharge: 2024-05-10 | Disposition: A | Discharge status: Home / Self Care | Attending: Emergency Medicine | Admitting: Emergency Medicine

## 2024-05-10 ENCOUNTER — Encounter (HOSPITAL_BASED_OUTPATIENT_CLINIC_OR_DEPARTMENT_OTHER): Payer: Self-pay | Admitting: Urology

## 2024-05-10 DIAGNOSIS — M5442 Lumbago with sciatica, left side: Secondary | ICD-10-CM | POA: Diagnosis not present

## 2024-05-10 DIAGNOSIS — M545 Low back pain, unspecified: Secondary | ICD-10-CM | POA: Diagnosis not present

## 2024-05-10 DIAGNOSIS — M5441 Lumbago with sciatica, right side: Secondary | ICD-10-CM | POA: Diagnosis not present

## 2024-05-10 DIAGNOSIS — Z7901 Long term (current) use of anticoagulants: Secondary | ICD-10-CM | POA: Diagnosis not present

## 2024-05-10 MED ORDER — DEXAMETHASONE SODIUM PHOSPHATE 10 MG/ML IJ SOLN: 10.0000 mg | Freq: Once | INTRAMUSCULAR | Status: DC

## 2024-05-10 MED ORDER — LIDOCAINE 5 % EX PTCH: 1.0000 | MEDICATED_PATCH | CUTANEOUS | 0 refills | Status: AC

## 2024-05-10 MED ORDER — DEXAMETHASONE SODIUM PHOSPHATE 10 MG/ML IJ SOLN
10.0000 mg | Freq: Once | INTRAMUSCULAR | Status: AC
  Administered 2024-05-10: 10 mg via INTRAMUSCULAR
  Filled 2024-05-10: qty 1

## 2024-05-10 MED ORDER — LIDOCAINE 5 % EX PTCH
1.0000 | MEDICATED_PATCH | CUTANEOUS | Status: DC
  Administered 2024-05-10: 1 via TRANSDERMAL
  Filled 2024-05-10: qty 1

## 2024-05-10 MED ORDER — PREDNISONE 10 MG (21) PO TBPK: ORAL_TABLET | Freq: Every day | ORAL | 0 refills | Status: AC

## 2024-05-10 NOTE — ED Provider Notes (Signed)
 [facility] Provider Note   CSN: [id number] Arrival date & time: 05/10/24  1024     Patient presents with: Back Pain   Paeton Studer is a 43 y.o. male who presents to the ED with low back pain for the last 2 weeks, states that he presented today due to an acute exacerbation, states that it feels like his back locks up on him.  Pain increased with flexion extension of the lumbar spine, as well as with lateral rotation.  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  He denies having any bowel or bladder incontinence, denies having any numbness or paresthesia in the distal extremities, and states that he does have pain that radiates into the hips bilaterally however does not track distally down of the lower extremities.    Back Pain      Prior to Admission medications   Medication Sig Start Date End Date Taking? Authorizing Provider  albuterol  (VENTOLIN  HFA) 108 (90 Base) MCG/ACT inhaler Inhale 1-2 puffs into the lungs every 6 (six) hours as needed for wheezing or shortness of breath. Patient not taking: Reported on 12/14/2023 08/15/23   Celestia Rosaline SQUIBB, NP  apixaban  (ELIQUIS ) 5 MG TABS tablet Take 1 tablet (5 mg total) by mouth 2 (two) times daily. Patient not taking: Reported on 12/14/2023 12/15/22   Celestia Rosaline SQUIBB, NP  famotidine  (PEPCID ) 20 MG tablet Take 1 tablet (20 mg total) by mouth 2 (two) times daily. Patient not taking: Reported on 12/14/2023 11/18/23   Mannie Pac T, DO  fluticasone  (FLONASE ) 50 MCG/ACT nasal spray Place 2 sprays into both nostrils daily. Patient not taking: Reported on 12/14/2023 08/15/23   Celestia Rosaline SQUIBB, NP  ketorolac  (TORADOL ) 10 MG tablet Take 1 tablet (10 mg total) by mouth every 6 (six) hours as needed. Patient not taking: Reported on 12/14/2023 11/18/23   Mannie Pac T, DO  levocetirizine  (XYZAL ) 5 MG tablet Take 1 tablet (5 mg total) by mouth every evening. Patient not taking: Reported on 12/14/2023 08/15/23   Celestia Rosaline SQUIBB, NP  methocarbamol  (ROBAXIN ) 500 MG tablet Take 1 tablet (500 mg total) by mouth 2 (two) times daily as needed for muscle spasms. Patient not taking: Reported on 12/14/2023 11/16/23   Hazen Darryle BRAVO, FNP  methylPREDNISolone  (MEDROL  DOSEPAK) 4 MG TBPK tablet Take as directed with food Patient not taking: Reported on 12/14/2023 11/21/23   Persons, Ronal Dragon, PA  rosuvastatin  (CRESTOR ) 20 MG tablet Take 1 tablet (20 mg total) by mouth daily. 12/14/23   Shlomo Wilbert SAUNDERS, MD    Allergies: Ibuprofen    Review of Systems  Musculoskeletal:  Positive for back pain.  All other systems reviewed and are negative.   Updated Vital Signs BP 126/75 (BP Location: Left Arm)   Pulse (!) 111   Temp (!) 97.1 F (36.2 C)   Resp 20   Ht 6' (1.829 m)   Wt (!) 136.4 kg   SpO2 97%   BMI 40.78 kg/m   Physical Exam Vitals and nursing note reviewed.  Constitutional:      General: He is not in acute distress.    Appearance: He is well-developed.  HENT:     Head: Normocephalic and atraumatic.  Eyes:     Conjunctiva/sclera: Conjunctivae normal.  Cardiovascular:     Rate and Rhythm: Normal rate and regular rhythm.  Heart sounds: No murmur heard. Pulmonary:     Effort: Pulmonary effort is normal. No respiratory distress.     Breath sounds: Normal breath sounds.  Abdominal:     Palpations: Abdomen is soft.     Tenderness: There is no abdominal tenderness.  Musculoskeletal:        General: No swelling.     Cervical back: Normal and neck supple.     Thoracic back: Normal.     Lumbar back: Tenderness present. Positive right straight leg raise test and positive left straight leg raise test.     Comments: There is tenderness elicited with palpation of the lumbar spine, beginning at the thoracolumbar junction distally to the lumbosacral junction.  Significant muscle  tension appreciated over the same area.  Skin:    General: Skin is warm and dry.     Capillary Refill: Capillary refill takes less than 2 seconds.  Neurological:     Mental Status: He is alert.  Psychiatric:        Mood and Affect: Mood normal.     (all labs ordered are listed, but only abnormal results are displayed) Labs Reviewed - No data to display  EKG: None  Radiology: DG Lumbar Spine Complete Result Date: 05/10/2024 CLINICAL DATA:  Low back pain for 2 weeks EXAM: LUMBAR SPINE - COMPLETE 4+ VIEW COMPARISON:  11/21/2023 exam from [facility] and CT abdomen from 08/25/2023. FINDINGS: There is no evidence of lumbar spine fracture. Alignment is normal. Intervertebral disc spaces are maintained. IMPRESSION: 1. No significant radiographic abnormality of the lumbar spine is identified. If further workup for degenerative disc disease/impingement is warranted, consider lumbar MRI. Electronically Signed   By: Ryan Salvage M.D.   On: 05/10/2024 12:36     Procedures   Medications Ordered in the ED  lidocaine  (LIDODERM ) 5 % 1 patch (has no administration in time range)  dexamethasone  (DECADRON ) injection 10 mg (has no administration in time range)                                    Medical Decision Making Amount and/or Complexity of Data Reviewed Radiology: ordered.  Risk Prescription drug management.   After assessing this, no acute spinal deformities are appreciated, he has no saddle anesthesia nor does he have any bowel or bladder incontinence.  Initially imaging of the lumbar spine did not show any bony abnormalities,, he does have profound tenderness in the lumbar spine should there be concern of degenerative disc disease.  As such, I have given him a referral to neurosurgery for further assessment of his lumbar spine.  Further have initially managed his lumbar pain with a dose of dexamethasone  given here in the ED as well as a Lidoderm  patch applied to the  affected area.  Prescriptions for prednisone  as well as for Lidoderm  patches to be used outpatient have been given.  Careful return precautions given to the patient, he understands agrees has no further concerns at this time.  As he is ambulatory without assistance, has normal vital signs, and no findings suggesting cauda equina syndrome, plan he stable for discharge at this time.     Final diagnoses:  Acute bilateral low back pain with bilateral sciatica    ED Discharge Orders     None          Myriam Dorn BROCKS, [place] 05/10/24 1400    Tegeler, Lonni PARAS, MD 05/10/24 [phone]

## 2024-05-10 NOTE — ED Triage Notes (Signed)
 Lower back pain x 2 weeks, denies injury  Pain worse with movement, states locking up on him

## 2024-07-08 ENCOUNTER — Encounter: Payer: Self-pay | Admitting: Radiology

## 2024-08-24 DIAGNOSIS — T1502XA Foreign body in cornea, left eye, initial encounter: Secondary | ICD-10-CM | POA: Diagnosis not present

## 2024-08-24 DIAGNOSIS — Y990 Civilian activity done for income or pay: Secondary | ICD-10-CM | POA: Diagnosis not present

## 2024-08-24 DIAGNOSIS — X58XXXA Exposure to other specified factors, initial encounter: Secondary | ICD-10-CM | POA: Diagnosis not present

## 2024-09-13 ENCOUNTER — Other Ambulatory Visit: Payer: Self-pay

## 2024-09-13 ENCOUNTER — Emergency Department (HOSPITAL_COMMUNITY)
Admission: EM | Admit: 2024-09-13 | Discharge: 2024-09-13 | Discharge status: Against Medical Advice | Payer: Self-pay | Attending: Emergency Medicine | Admitting: Emergency Medicine

## 2024-09-13 ENCOUNTER — Emergency Department (HOSPITAL_COMMUNITY): Payer: Self-pay

## 2024-09-13 ENCOUNTER — Encounter (HOSPITAL_COMMUNITY): Payer: Self-pay | Admitting: Emergency Medicine

## 2024-09-13 DIAGNOSIS — Z5321 Procedure and treatment not carried out due to patient leaving prior to being seen by health care provider: Secondary | ICD-10-CM | POA: Insufficient documentation

## 2024-09-13 DIAGNOSIS — J45909 Unspecified asthma, uncomplicated: Secondary | ICD-10-CM | POA: Insufficient documentation

## 2024-09-13 DIAGNOSIS — R197 Diarrhea, unspecified: Secondary | ICD-10-CM | POA: Insufficient documentation

## 2024-09-13 DIAGNOSIS — R55 Syncope and collapse: Secondary | ICD-10-CM | POA: Insufficient documentation

## 2024-09-13 DIAGNOSIS — R630 Anorexia: Secondary | ICD-10-CM | POA: Insufficient documentation

## 2024-09-13 LAB — COMPREHENSIVE METABOLIC PANEL WITH GFR
ALT: 31 U/L (ref 0–44)
AST: 34 U/L (ref 15–41)
Albumin: 4.1 g/dL (ref 3.5–5.0)
Alkaline Phosphatase: 82 U/L (ref 38–126)
Anion gap: 12 (ref 5–15)
BUN: 13 mg/dL (ref 6–20)
CO2: 21 mmol/L — ABNORMAL LOW (ref 22–32)
Calcium: 9 mg/dL (ref 8.9–10.3)
Chloride: 102 mmol/L (ref 98–111)
Creatinine, Ser: 1.05 mg/dL (ref 0.61–1.24)
GFR, Estimated: >60 mL/min
Glucose, Bld: 120 mg/dL — ABNORMAL HIGH (ref 70–99)
Potassium: 3.7 mmol/L (ref 3.5–5.1)
Sodium: 135 mmol/L (ref 135–145)
Total Bilirubin: 0.5 mg/dL (ref 0.0–1.2)
Total Protein: 7.4 g/dL (ref 6.5–8.1)

## 2024-09-13 LAB — CBC
HCT: 40.7 % (ref 39.0–52.0)
Hemoglobin: 14.5 g/dL (ref 13.0–17.0)
MCH: 26.5 pg (ref 26.0–34.0)
MCHC: 35.6 g/dL (ref 30.0–36.0)
MCV: 74.3 fL — ABNORMAL LOW (ref 80.0–100.0)
Platelets: 233 K/uL (ref 150–400)
RBC: 5.48 MIL/uL (ref 4.22–5.81)
RDW: 14.4 % (ref 11.5–15.5)
WBC: 10.7 K/uL — ABNORMAL HIGH (ref 4.0–10.5)
nRBC: 0 % (ref 0.0–0.2)

## 2024-09-13 LAB — RESP PANEL BY RT-PCR (RSV, FLU A&B, COVID)  RVPGX2
Influenza A by PCR: POSITIVE — AB
Influenza B by PCR: NEGATIVE
Resp Syncytial Virus by PCR: NEGATIVE
SARS Coronavirus 2 by RT PCR: NEGATIVE

## 2024-09-13 MED ORDER — IPRATROPIUM-ALBUTEROL 0.5-2.5 (3) MG/3ML IN SOLN
3.0000 mL | Freq: Once | RESPIRATORY_TRACT | Status: AC
  Administered 2024-09-13: 3 mL via RESPIRATORY_TRACT
  Filled 2024-09-13: qty 3

## 2024-09-13 MED ORDER — ACETAMINOPHEN 325 MG PO TABS
650.0000 mg | ORAL_TABLET | Freq: Once | ORAL | Status: AC
  Administered 2024-09-13: 650 mg via ORAL
  Filled 2024-09-13: qty 2

## 2024-09-13 MED ORDER — METHYLPREDNISOLONE SODIUM SUCC 125 MG IJ SOLR
125.0000 mg | Freq: Once | INTRAMUSCULAR | Status: AC
  Administered 2024-09-13: 125 mg via INTRAVENOUS
  Filled 2024-09-13: qty 2

## 2024-09-13 NOTE — ED Notes (Signed)
 Pt stated they were leaving, IV was removed

## 2024-09-13 NOTE — ED Provider Triage Note (Signed)
 Emergency Medicine Provider Triage Evaluation Note  Jeffery Blackwell , a 44 y.o. male  was evaluated in triage.  Pt complains of history of asthma presents today for syncopal episode at grocery store.  Patient has had URI symptoms, reduced appetite, and diarrhea times several days.  Review of Systems  Positive: Diarrhea, URI symptoms, reduced appetite, syncope Negative: Nausea, vomiting, chest pain, shortness of breath abdominal pain  Physical Exam  BP 125/65 (BP Location: Right Arm)   Pulse 95   Temp (!) 100.6 F (38.1 C) (Oral)   Resp 18   Ht 6' (1.829 m)   Wt 122.5 kg   SpO2 94%   BMI 36.62 kg/m  Gen:   Awake, no distress   Resp:  Normal effort  MSK:   Moves extremities without difficulty  Other:    Medical Decision Making  Medically screening exam initiated at 12:09 PM.  Appropriate orders placed.  Jeffery Blackwell was informed that the remainder of the evaluation will be completed by another provider, this initial triage assessment does not replace that evaluation, and the importance of remaining in the ED until their evaluation is complete.  Labs and imaging ordered   Jeffery Blackwell 09/13/24 1211

## 2024-09-13 NOTE — ED Triage Notes (Signed)
 Pt to ER via EMS from local store where he had a syncopal episode while waiting in line.  Pt reports several day history of wheezing and diarrhea with decreased PO intake.  CBD for EMS 210, no hx of diabetes.  Pt was orthostatic for EMS.  Arrives alert and oriented x 4.
